# Patient Record
Sex: Female | Born: 1970
Health system: Southern US, Community
[De-identification: ages and names within clinical notes are randomized; demographics above are authoritative.]

## PROBLEM LIST (undated history)

## (undated) DIAGNOSIS — R112 Nausea with vomiting, unspecified: Secondary | ICD-10-CM

## (undated) DIAGNOSIS — T7840XA Allergy, unspecified, initial encounter: Secondary | ICD-10-CM

## (undated) DIAGNOSIS — D519 Vitamin B12 deficiency anemia, unspecified: Secondary | ICD-10-CM

## (undated) DIAGNOSIS — C801 Malignant (primary) neoplasm, unspecified: Secondary | ICD-10-CM

## (undated) DIAGNOSIS — C439 Malignant melanoma of skin, unspecified: Secondary | ICD-10-CM

## (undated) DIAGNOSIS — Z9889 Other specified postprocedural states: Secondary | ICD-10-CM

## (undated) HISTORY — DX: Other specified postprocedural states: Z98.890

## (undated) HISTORY — PX: TUBAL LIGATION: SHX77

## (undated) HISTORY — DX: Allergy, unspecified, initial encounter: T78.40XA

## (undated) HISTORY — DX: Nausea with vomiting, unspecified: R11.2

## (undated) HISTORY — DX: Malignant (primary) neoplasm, unspecified: C80.1

## (undated) HISTORY — PX: WISDOM TOOTH EXTRACTION: SHX21

## (undated) HISTORY — DX: Vitamin B12 deficiency anemia, unspecified: D51.9

## (undated) HISTORY — PX: OTHER SURGICAL HISTORY: SHX169

## (undated) HISTORY — DX: Malignant melanoma of skin, unspecified: C43.9

---

## 1997-11-03 ENCOUNTER — Other Ambulatory Visit: Admission: RE | Admit: 1997-11-03 | Discharge: 1997-11-03 | Payer: Self-pay | Admitting: Obstetrics and Gynecology

## 1997-11-25 ENCOUNTER — Inpatient Hospital Stay (HOSPITAL_COMMUNITY): Admission: AD | Admit: 1997-11-25 | Discharge: 1997-11-28 | Payer: Self-pay | Admitting: Gynecology

## 1998-01-08 ENCOUNTER — Other Ambulatory Visit: Admission: RE | Admit: 1998-01-08 | Discharge: 1998-01-08 | Payer: Self-pay | Admitting: Gynecology

## 1998-12-14 ENCOUNTER — Other Ambulatory Visit: Admission: RE | Admit: 1998-12-14 | Discharge: 1998-12-14 | Payer: Self-pay | Admitting: Gynecology

## 1999-06-28 ENCOUNTER — Other Ambulatory Visit: Admission: RE | Admit: 1999-06-28 | Discharge: 1999-06-28 | Payer: Self-pay | Admitting: Gynecology

## 1999-12-18 ENCOUNTER — Other Ambulatory Visit: Admission: RE | Admit: 1999-12-18 | Discharge: 1999-12-18 | Payer: Self-pay | Admitting: Gynecology

## 2001-04-14 ENCOUNTER — Other Ambulatory Visit: Admission: RE | Admit: 2001-04-14 | Discharge: 2001-04-14 | Payer: Self-pay | Admitting: Gynecology

## 2002-05-10 ENCOUNTER — Other Ambulatory Visit: Admission: RE | Admit: 2002-05-10 | Discharge: 2002-05-10 | Payer: Self-pay | Admitting: Gynecology

## 2003-06-19 ENCOUNTER — Other Ambulatory Visit: Admission: RE | Admit: 2003-06-19 | Discharge: 2003-06-19 | Payer: Self-pay | Admitting: Gynecology

## 2004-01-10 ENCOUNTER — Inpatient Hospital Stay (HOSPITAL_COMMUNITY): Admission: AD | Admit: 2004-01-10 | Discharge: 2004-01-12 | Payer: Self-pay | Admitting: Gynecology

## 2004-02-07 ENCOUNTER — Other Ambulatory Visit: Admission: RE | Admit: 2004-02-07 | Discharge: 2004-02-07 | Payer: Self-pay | Admitting: Gynecology

## 2005-11-18 ENCOUNTER — Other Ambulatory Visit: Admission: RE | Admit: 2005-11-18 | Discharge: 2005-11-18 | Payer: Self-pay | Admitting: Gynecology

## 2006-04-03 ENCOUNTER — Ambulatory Visit (HOSPITAL_COMMUNITY): Admission: RE | Admit: 2006-04-03 | Discharge: 2006-04-03 | Payer: Self-pay | Admitting: Gynecology

## 2006-04-08 ENCOUNTER — Ambulatory Visit: Payer: Self-pay | Admitting: Obstetrics & Gynecology

## 2006-04-10 ENCOUNTER — Ambulatory Visit: Payer: Self-pay | Admitting: Obstetrics & Gynecology

## 2006-04-14 ENCOUNTER — Ambulatory Visit: Payer: Self-pay | Admitting: Obstetrics & Gynecology

## 2006-04-17 ENCOUNTER — Ambulatory Visit: Payer: Self-pay | Admitting: Gynecology

## 2006-04-20 ENCOUNTER — Ambulatory Visit: Payer: Self-pay | Admitting: Obstetrics & Gynecology

## 2006-04-24 ENCOUNTER — Ambulatory Visit: Payer: Self-pay | Admitting: Obstetrics and Gynecology

## 2006-04-28 ENCOUNTER — Ambulatory Visit: Payer: Self-pay | Admitting: Obstetrics and Gynecology

## 2006-05-01 ENCOUNTER — Ambulatory Visit: Payer: Self-pay | Admitting: Family Medicine

## 2006-05-05 ENCOUNTER — Ambulatory Visit: Payer: Self-pay | Admitting: Obstetrics and Gynecology

## 2006-05-06 ENCOUNTER — Inpatient Hospital Stay (HOSPITAL_COMMUNITY): Admission: RE | Admit: 2006-05-06 | Discharge: 2006-05-09 | Payer: Self-pay | Admitting: Gynecology

## 2006-05-06 ENCOUNTER — Encounter (INDEPENDENT_AMBULATORY_CARE_PROVIDER_SITE_OTHER): Payer: Self-pay | Admitting: *Deleted

## 2006-06-09 ENCOUNTER — Other Ambulatory Visit: Admission: RE | Admit: 2006-06-09 | Discharge: 2006-06-09 | Payer: Self-pay | Admitting: Gynecology

## 2007-10-06 ENCOUNTER — Other Ambulatory Visit: Admission: RE | Admit: 2007-10-06 | Discharge: 2007-10-06 | Payer: Self-pay | Admitting: Obstetrics and Gynecology

## 2008-02-10 IMAGING — US US OB DETAIL+14 WK
1 series · 14 of 28 positions shown · non-contrast
Comparison: none

OBSTETRICAL ULTRASOUND:
 This ultrasound was performed in The [HOSPITAL], and the [facility] report will be stored to [REDACTED] PACS.

[Series 1: us ob detail+14 wk · 14 of 56 slices shown]
[im 3/56]
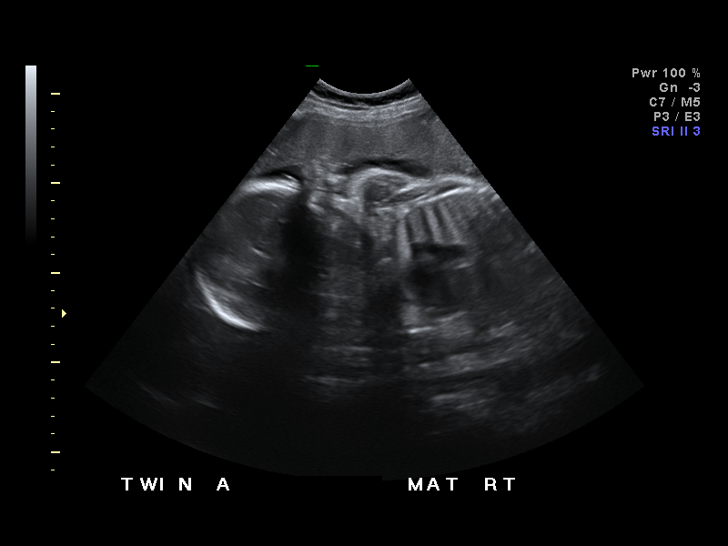
[im 7/56]
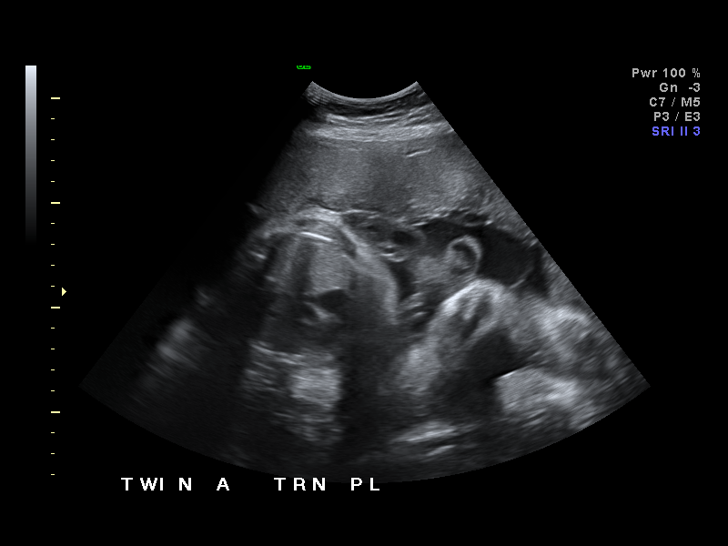
[im 11/56]
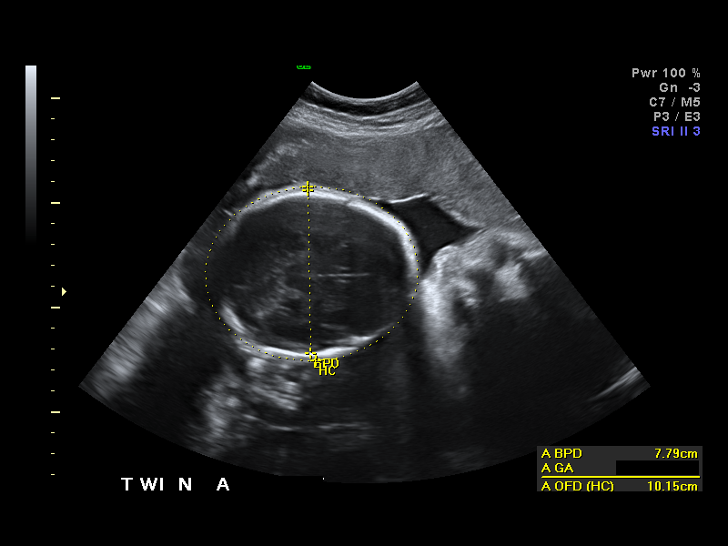
[im 15/56]
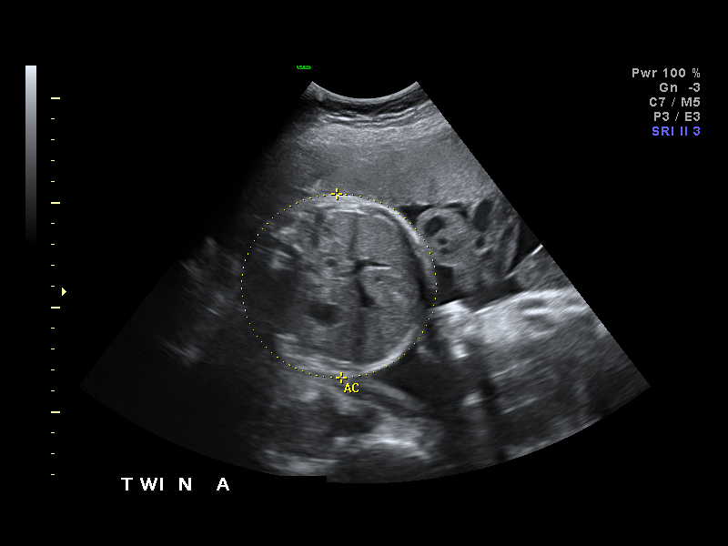
[im 19/56]
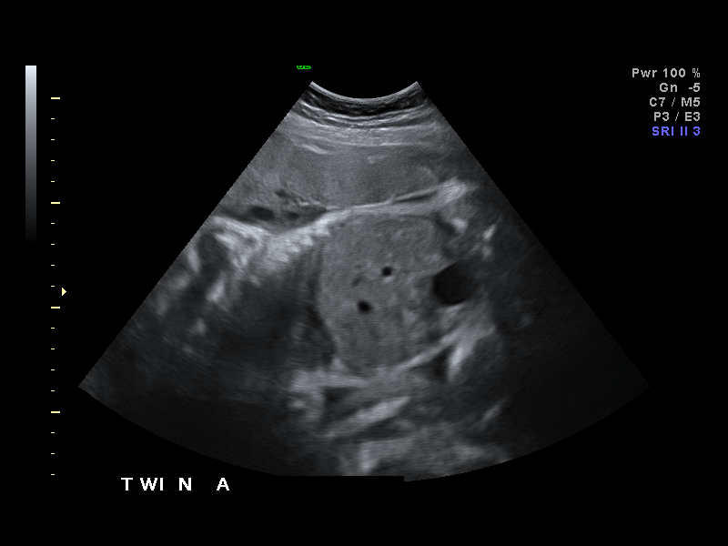
[im 23/56]
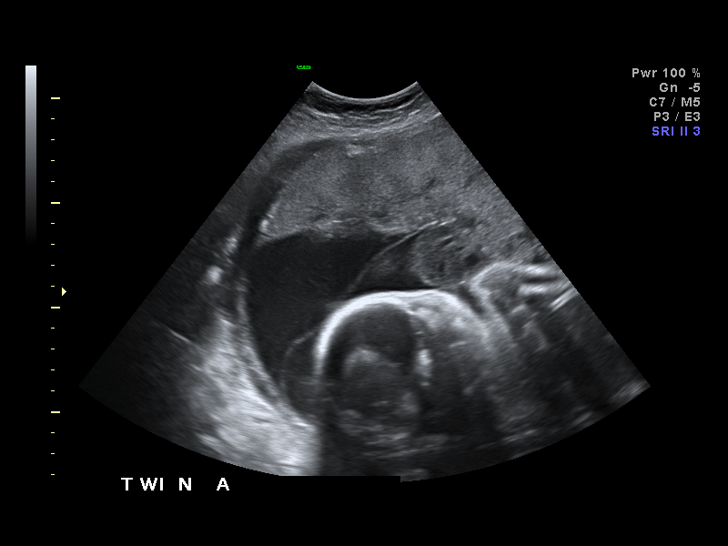
[im 27/56]
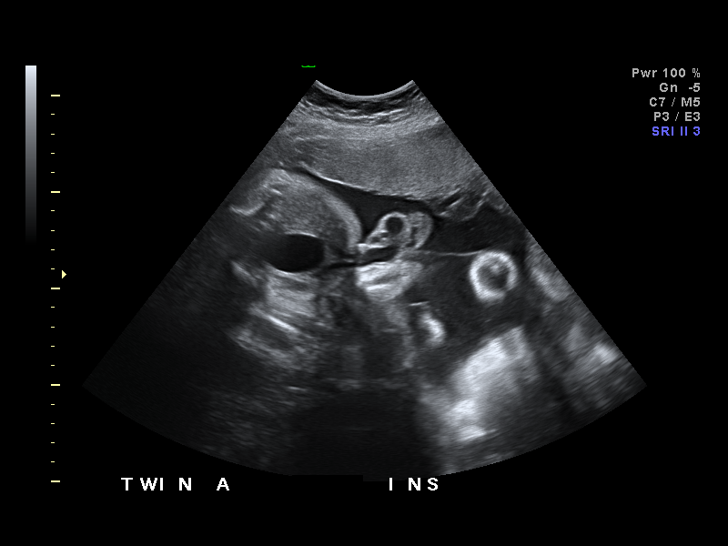
[im 31/56]
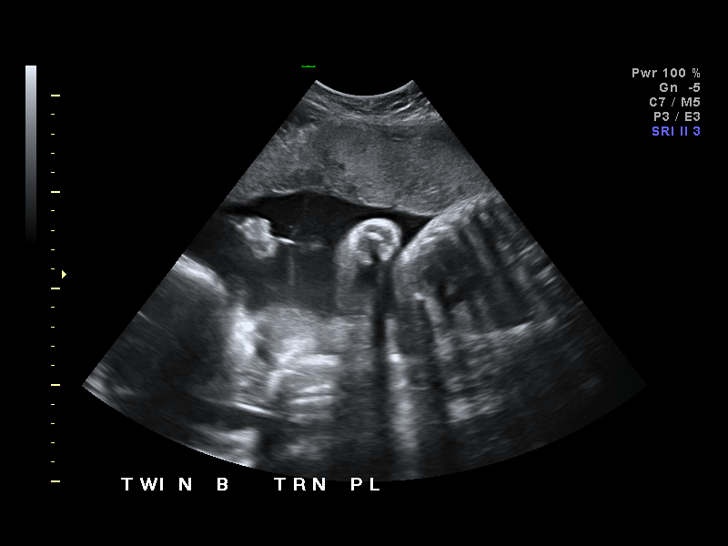
[im 35/56]
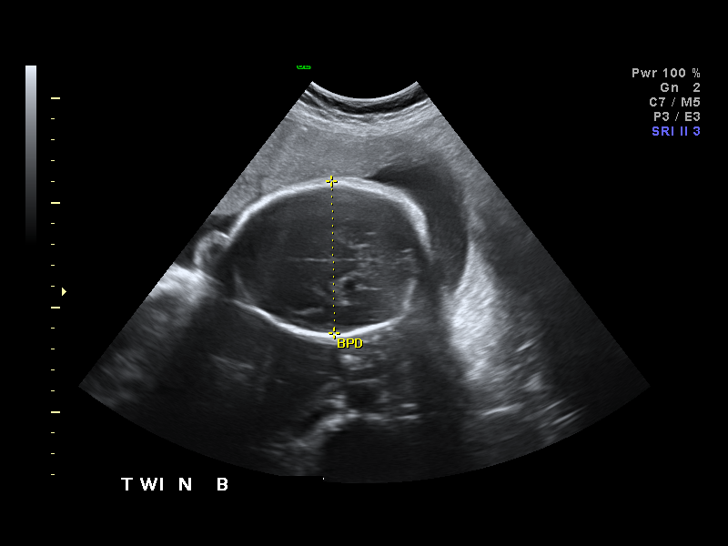
[im 39/56]
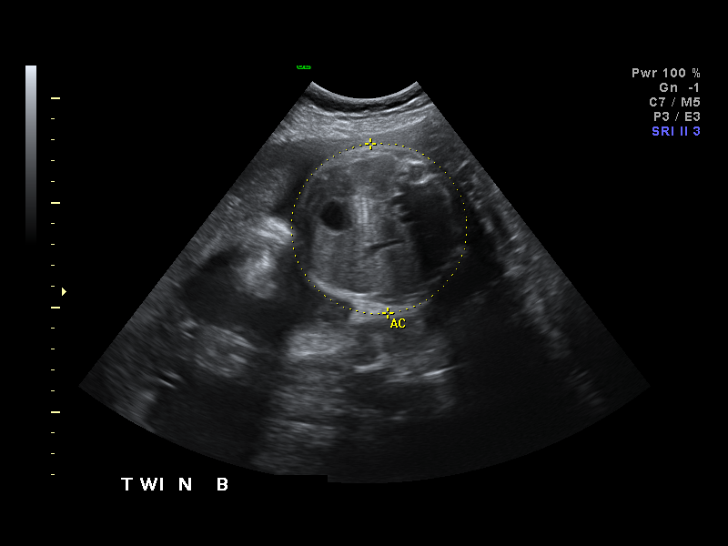
[im 43/56]
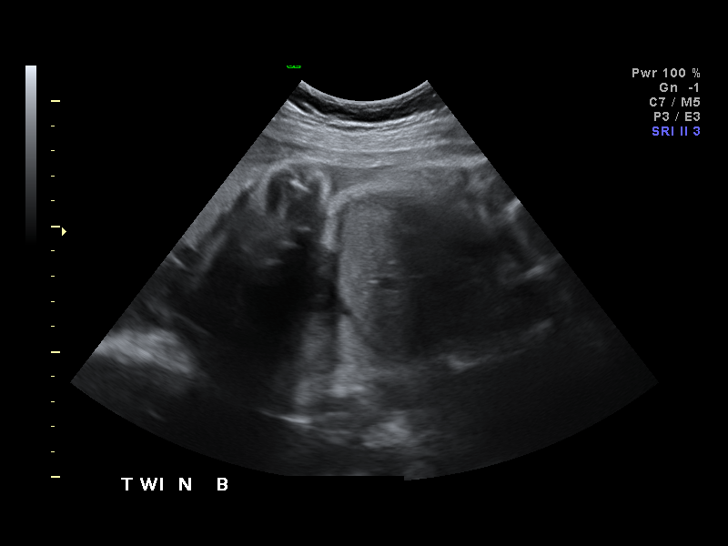
[im 47/56]
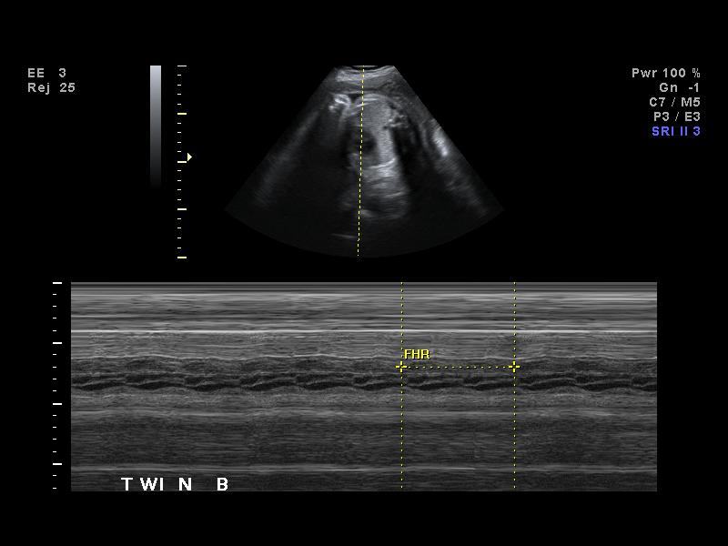
[im 51/56]
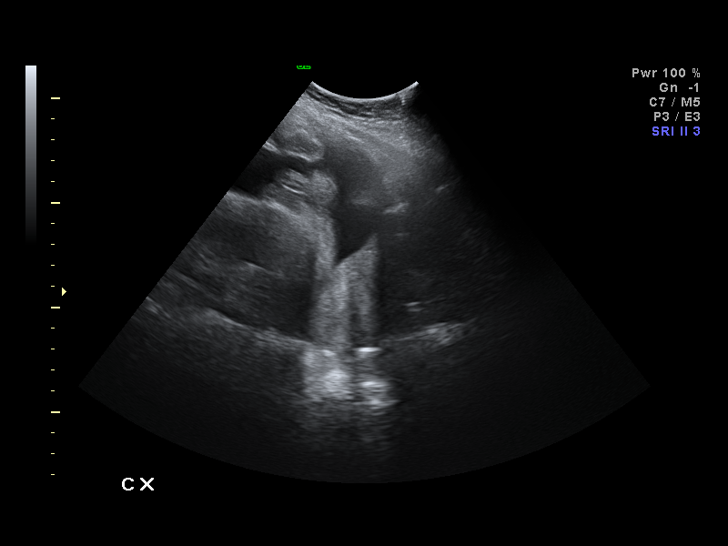
[im 56/56]
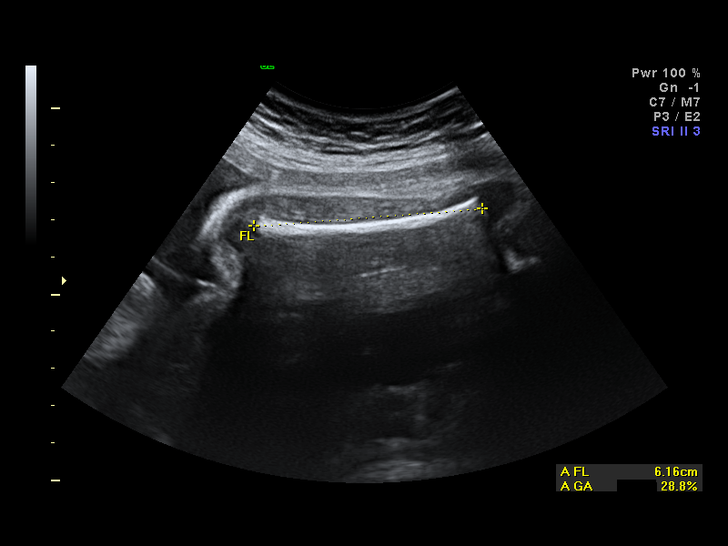

[14 of 28 positions shown; findings below may reference images not displayed]

IMPRESSION: The [facility] report has also been faxed to the ordering physician.

## 2008-04-05 ENCOUNTER — Ambulatory Visit: Payer: Self-pay | Admitting: Gynecology

## 2008-12-14 ENCOUNTER — Ambulatory Visit: Payer: Self-pay | Admitting: Women's Health

## 2008-12-14 ENCOUNTER — Encounter: Payer: Self-pay | Admitting: Women's Health

## 2008-12-14 ENCOUNTER — Other Ambulatory Visit: Admission: RE | Admit: 2008-12-14 | Discharge: 2008-12-14 | Payer: Self-pay | Admitting: Gynecology

## 2010-08-23 NOTE — Discharge Summary (Signed)
NAMEROBY, SPALLA       ACCOUNT NO.:  [id number]   MEDICAL RECORD NO.:  [id number]          PATIENT TYPE:  INP   LOCATION:                                [facility]   PHYSICIAN:  Juan H. Lily Peer, M.D.DATE OF BIRTH:  [birth year]   DATE OF ADMISSION:  05/05/2006  DATE OF DISCHARGE:  05/09/2006                               DISCHARGE SUMMARY   HISTORY:  Patient is a 40 year old gravida 3, para 2, at [redacted] weeks  gestation with twin gestation (monochorionic and dichorionic) in breech  presentation.  Underwent a primary cesarean section along with bilateral  tubal sterilization procedure on May 06, 2006.  The patient at the  time of surgery was found to have a left ovarian mass, which appears to  be a fibroma, which was excised and is pending at the time of this  dictation.  Patient delivered two female infants each with Apgars of 8  and 9.  The first twin weighed 6 pounds, 4 ounces.  The second twin  weighed 4 pounds, 12 ounces.  The patient did well postoperatively.  Her  hemoglobin and hematocrit were 10.1 and 28.5, respectively with a  platelet count of 183,000.  She was O+.  Rubella was negative.  She was  starting to ambulate.  Her Foley catheter was discontinued after 24  hours.  Her diet was gradually increased from a clear liquid to a  regular diet.  She was passing flatus, and she was ready to be  discharged home on her third postoperative day.  Her incision was  intact.  The incisional staples were to be removed and Steri-stripped  before discharged.   FINAL DIAGNOSES:  1. Term intrauterine pregnancy (twins, term, 58 weeks/delivered).  2. Homero Fellers breech presentation x2.  3. Request for elective permanent sterilization.  4. Left ovarian mass.   PROCEDURE PERFORMED:  1. Primary low uterine transverse cesarean section.  2. Excision of left ovarian mass, pathology report pending.  3. Bilateral tubal sterilization, procedure Pomeroy technique.   FINAL  DISPOSITION:  Followed.  Patient was discharged home on the third  postoperative day.  She was passing flatus, tolerating a regular diet  well.  Her staples were removed.  Her incision was Steri-stripped.  She  was to receive the Rubella vaccine before discharge.  She was to  continue her prenatal vitamins and iron at home, and a prescription was  given for Tylox to take 1 p.o. q.4-6h. p.r.n. pain and for Motrin 800 mg  1 p.o. t.i.d. p.r.n.  She was instructed to follow up in the office in  four weeks for her postpartum visit.      Juan H. Lily Peer, M.D.  Electronically Signed     JHF/MEDQ  D:  05/09/2006  T:  05/09/2006  Job:  [id number]

## 2010-08-23 NOTE — H&P (Signed)
NAMETEILA, SKALSKY       ACCOUNT NO.:  [id number]   MEDICAL RECORD NO.:  [id number]          PATIENT TYPE:  INP   LOCATION:                                [facility]   PHYSICIAN:  Juan H. Lily Peer, M.D.DATE OF BIRTH:  08-Mar-1971   DATE OF ADMISSION:  DATE OF DISCHARGE:                              HISTORY & PHYSICAL   CHIEF COMPLAINT:  1. Twin gestation (monochorionic diamniotic).  2. Breech presentation.  3. Term pregnancy, 37 weeks' estimated gestational age.   HISTORY:  The patient is a 40 year old, gravida 3, para 2, with a  corrected estimated date of confinement on May 27, 2005.  The  patient is with monochorionic diamniotic twins in breech presentation.  The patient had been offered genetic amniocentesis and testing, and she  had declined.  She said that she would not terminate under any  circumstances.  The patient had been followed closely with serial  ultrasounds for monitoring the growth of the twins.  At one point, there  was a question if there was discordancy at 32 weeks where twin A was in  the 76th percentile and twin B in the 7th percentile.  She will be  referred to the [facility].  The estimated fetal weight  on twin A was in the 49th percentile and in twin B in the 25th  percentile, a discordancy of 13%.  It was recommended to progress with  the pregnancy because all other parameters looked good and to followup  with an ultrasound in two to three weeks for intimal growth and twice  weekly non-stress testing.   The patient was followed closely.  Her last ultrasound and followup exam  were on January 18th.  Twin A was now in the 66th percentile in growth  and twin B in the 35th percentile in growth curve.  The amniotic fluid  was normal for both, but they were both in the breech presentation.  The  patient is scheduled now for a primary cesarean section along with an  elective bilateral tubal sterilization procedure.   PAST  MEDICAL HISTORY:  The patient has had two prior normal spontaneous  vaginal deliveries.  No other medical problems reported.   ALLERGIES:  She denies any allergies.   REVIEW OF SYSTEMS:  See hospital form.   PHYSICAL EXAMINATION:  VITAL SIGNS:  The patient's blood pressure is  120/78.  Urine was negative for protein or glucose.  Her weight was 193  pounds.  HEENT:  Unremarkable.  NECK:  The neck is supple.  The trachea is midline.  No carotid bruits.  No thyromegaly.  LUNGS:  Clear to auscultation without any rhonchi or wheezes.  HEART:  Regular rate and rhythm.  No murmurs or gallops.  BREASTS:  Not done.  ABDOMEN:  Gravid uterus.  Fundal height 41 cm.  Cervix long, closed, and  posterior.  EXTREMITIES:  DTRs 1+.  Negative for clonus.   LABORATORY DATA:  The prenatal labs revealed 0 positive blood type.  Negative antibody screen.  VDRL was nonreactive.  Rubella immune.  Hepatitis B surface antigen and HIV were negative.  The alpha  fetoprotein was declined.  GBS culture is pending at the time of this  dictation.   ASSESSMENT:  This is a 40 year old, gravida 3, para 2, at 42 weeks'  gestation with twin gestation (monochorionic diamniotic) both in breech  presentation.  The patient is scheduled for a primary cesarean section  with elective bilateral tubal sterilization procedure.  The risks,  benefits, pros, cons, and failure rate of tubal sterilization procedure  were discussed as well.  All of her questions were answered and will  follow accordingly.   PLAN:  The patient is scheduled for a primary cesarean section along  with bilateral tubal sterilization procedure on Wednesday, May 06, 2006 at [facility].      [place] H. Lily Peer, M.D.  Electronically Signed     JHF/MEDQ  D:  05/04/2006  T:  05/04/2006  Job:  [id number]

## 2010-08-23 NOTE — Op Note (Signed)
NAMEWEDA, BAUMGARNER       ACCOUNT NO.:  [id number]   MEDICAL RECORD NO.:  [id number]          PATIENT TYPE:  INP   LOCATION:  9105                          [facility]ACILITY:  [facility]   PHYSICIAN:  Juan H. Lily Peer, M.D.DATE OF BIRTH:  October 19, 1970   DATE OF PROCEDURE:  05/06/2006  DATE OF DISCHARGE:                               OPERATIVE REPORT   SURGEON:  Juan H. Lily Peer, M.D.   FIRST ASSISTANT:  Timothy P. Fontaine, M.D.   INDICATIONS FOR OPERATION:  A 40 year old gravida 3, para 2 with twin  gestation, breech presentation.  The patient also requesting elective  permanent sterilization; and also with evidence of slight discordant  growth in the twins.   PREOPERATIVE DIAGNOSIS:  1. Term intrauterine pregnancy.  2. Twin gestation.  3. Breech presentation x2.  4. Discordant growth.  5. Requests elective permanent sterilization.   POSTOPERATIVE DIAGNOSIS:  1. Term intrauterine pregnancy.  2. Twin gestation.  3. Breech presentation x2.  4. Discordant growth.  5. Requests elective permanent sterilization.   ANESTHESIA:  Spinal.   PROCEDURE PERFORMED:  1. Primary lower uterine segment transverse cesarean section.  2. Bilateral tubal sterilization procedure, Pomeroy technique.   FINDINGS:  Normal pelvic anatomy.  Twin gestation both in the breech  presentation, separate sacs clear amniotic fluid.  Twin A female, Apgars  of 8 and 9 and 6 pounds 4 ounces; twin B Apgars of 8 and 09 and weight 4  pounds 12 ounces.   DESCRIPTION OF OPERATION:  After the patient was adequately counseled,  she was taken to the operating room where she underwent successful  spinal anesthesia.  The abdomen was prepped and draped in the usual  sterile fashion.  A Pfannenstiel skin incision was made 2 cm above the  symphysis pubis.  Of note, a Foley catheter had previously been inserted  prior to commencement of the operation in an effort to monitor urinary  output.  After the skin incision was made,  the incision was carried down  to the rectus fascia where a midline nick was made.  The fascia was  incised in a transverse fashion.  The midline raphe was entered  cautiously.  The bladder flap was established; and the lower uterine  segment was incised in a transverse fashion.  Clear amniotic fluid was  present.   The sac of twin A was ruptured and was delivered in the breech  presentation.  The nasopharyngeal area was bulb suctioned.  The cord was  doubly clamped and excised.  The newborn gave an immediate cry; and was  passed off to the pediatrician who gave the above-mentioned parameters.  The sac of twin B was ruptured; and twin B was delivered also in the  breech presentation as well.  The cord was doubly clamped and excised;  and the nasopharyngeal area was bulb suctioned; and twin B was  appropriately identified, and passed off to the neonatologist who was in  attendance who gave the above-mentioned parameters.  After the placenta  and cords were delivered from the intrauterine cavity; they were  submitted for histological evaluation with appropriate identification.  Pitocin drip was started.  The patient  received a gram of Ancef.  The  uterus was exteriorized.  The intrauterine cavity was swept clear of the  remaining products of conception; and the lower uterine segment  transverse incision was closed in a double-layered fashion.  The first  layer in a interlocking stitch; the second layer in an imbricating  manner.   Attention was then placed to the proximal portion of fallopian tube.  A  2-cm segment was tied with 3-0 Vicryl suture x2; and a 2 cm segment was  excised, passed off the operative field, and the remaining stumps were  Bovie cauterized.  It was at this time that a growth extending from over  the left ovary was evident with a gelatinous material extruding from the  capsule area.  This area was excised with the Bovie; and passed off  histological evaluation.    The right ovary appeared to be normal, and the proximal one third  portion of the right fallopian tube was grasped with Babcock clamp and  then a 2-cm segment was also tied with 3-0 Vicryl sutures x2; and  excised; and passed off the operative field for histological evaluation.  The remaining stump was also Bovie cauterized.  The uterus was then  placed back into the abdominal cavity.  The pelvic cavity was irrigated  with normal saline solution.  After ascertaining adequate hemostasis,  closure was started.  The visceral peritoneum was not closed.  The  rectus fascia was closed with a running stitch of #0 Vicryl suture; and  the subcutaneous bleeders were Bovie cauterized.  The skin was  reapproximated with skin clips; followed by placement of Xeroform gauze  and 4 x 4 and dressing.  The patient tolerated the procedure well; was  transferred to recovery room with stable vital signs.  Blood loss was 1  liter.  IV fluids consisted of 4100 mL of lactated Ringer's; and urine  output was 100 mL.      Juan H. Lily Peer, M.D.  Electronically Signed     JHF/MEDQ  D:  05/06/2006  T:  05/06/2006  Job:  [id number]

## 2010-08-23 NOTE — H&P (Signed)
NAMESHARONDA, Anna Hernandez       ACCOUNT NO.:  [id number]   MEDICAL RECORD NO.:  [id number]          PATIENT TYPE:  MAT   LOCATION:  [facility]C                          [facility]ILITY:  [facility]   PHYSICIAN:  Ivor Costa. Farrel Gobble, M.D. DATE OF BIRTH:  30-Dec-1970   DATE OF ADMISSION:  DATE OF DISCHARGE:                                HISTORY & PHYSICAL   CHIEF COMPLAINT:  Post-dates pregnancy.   HISTORY OF PRESENT ILLNESS:  The patient is a 40 year old, G2, P5, with an  estimated date of confinement of January 01, 2004, based on a first  trimester ultrasound, who is currently 11 and 3/7ths weeks, who presents now  for an elective induction of labor secondary to post-date status.  Her  pregnancy has been complicated by advanced maternal age, for which an  amniocentesis was performed, and normal chromosome analysis.  The patient is  without any complaints.  She reports good fetal movement.  No vaginal  bleeding, and no contractions.  She is O positive, antibody negative, RPR  nonreactive, rubella immune, but equivocal titer, hepatitis B surface  antigen non-reactive, HIV non-reactive, GBS negative.  Refer to the  hollisters.   PHYSICAL EXAMINATION:  GENERAL:  She is a well-appearing gravida in no acute  distress.  A 41 pound weight gain.  VITAL SIGNS:  She is afebrile.  Her vital signs are stable.  HEART:  Regular rate.  LUNGS:  Clear to auscultation.  ABDOMEN:  Gravid, soft, nontender, with a fundal height of 39.  Heart tones  were auscultated.  PELVIC:  On vaginal exam, she was 1, long, -3, and posterior.  EXTREMITIES:  A trace edema.   ASSESSMENT:  Post-dates pregnancy, for induction.   The patient will present in the evening for Cervidil and begin high-dose  Pitocin at 6 a.m.      THL/MEDQ  D:  01/04/2004  T:  01/04/2004  Job:  [id number]

## 2010-09-30 ENCOUNTER — Other Ambulatory Visit (HOSPITAL_COMMUNITY)
Admission: RE | Admit: 2010-09-30 | Discharge: 2010-09-30 | Disposition: A | Discharge status: Home / Self Care | Payer: 59 | Source: Ambulatory Visit | Attending: Obstetrics and Gynecology | Admitting: Obstetrics and Gynecology

## 2010-09-30 ENCOUNTER — Other Ambulatory Visit: Payer: Self-pay | Admitting: Women's Health

## 2010-09-30 ENCOUNTER — Encounter (INDEPENDENT_AMBULATORY_CARE_PROVIDER_SITE_OTHER): Payer: 59 | Admitting: Women's Health

## 2010-09-30 DIAGNOSIS — R82998 Other abnormal findings in urine: Secondary | ICD-10-CM

## 2010-09-30 DIAGNOSIS — Z124 Encounter for screening for malignant neoplasm of cervix: Secondary | ICD-10-CM | POA: Insufficient documentation

## 2010-09-30 DIAGNOSIS — Z01419 Encounter for gynecological examination (general) (routine) without abnormal findings: Secondary | ICD-10-CM

## 2011-06-09 ENCOUNTER — Other Ambulatory Visit: Payer: Self-pay | Admitting: Dermatology

## 2011-07-03 ENCOUNTER — Other Ambulatory Visit: Payer: Self-pay | Admitting: Dermatology

## 2012-01-29 ENCOUNTER — Encounter: Payer: Self-pay | Admitting: Women's Health

## 2012-05-14 ENCOUNTER — Encounter: Payer: Self-pay | Admitting: Women's Health

## 2012-05-14 ENCOUNTER — Ambulatory Visit (INDEPENDENT_AMBULATORY_CARE_PROVIDER_SITE_OTHER): Payer: 59 | Admitting: Women's Health

## 2012-05-14 VITALS — BP 106/60 | Ht 63.5 in | Wt 143.0 lb

## 2012-05-14 DIAGNOSIS — Z13220 Encounter for screening for lipoid disorders: Secondary | ICD-10-CM

## 2012-05-14 DIAGNOSIS — Z01419 Encounter for gynecological examination (general) (routine) without abnormal findings: Secondary | ICD-10-CM

## 2012-05-14 DIAGNOSIS — E079 Disorder of thyroid, unspecified: Secondary | ICD-10-CM

## 2012-05-14 DIAGNOSIS — Z833 Family history of diabetes mellitus: Secondary | ICD-10-CM

## 2012-05-14 DIAGNOSIS — C439 Malignant melanoma of skin, unspecified: Secondary | ICD-10-CM

## 2012-05-14 LAB — CBC WITH DIFFERENTIAL/PLATELET
Basophils Relative: 0 % (ref 0–1)
Eosinophils Absolute: 0.1 10*3/uL (ref 0.0–0.7)
HCT: 37.1 % (ref 36.0–46.0)
Hemoglobin: 12.4 g/dL (ref 12.0–15.0)
Lymphs Abs: 2 10*3/uL (ref 0.7–4.0)
MCHC: 33.4 g/dL (ref 30.0–36.0)
Monocytes Relative: 7 % (ref 3–12)
Neutrophils Relative %: 61 % (ref 43–77)
Platelets: 317 10*3/uL (ref 150–400)
RBC: 4.15 MIL/uL (ref 3.87–5.11)
RDW: 13.6 % (ref 11.5–15.5)

## 2012-05-14 NOTE — Progress Notes (Signed)
Anna Hernandez [birth year] [id number]    History:    The patient presents for annual exam.  Regular monthly cycle/BTL. History of normal mammograms. Pap ascus with negative C&B 2001, normal Paps following. Melanoma on left shoulder Dr. Jordan/2013.   Past medical history, past surgical history, family history and social history were all reviewed and documented in the EPIC chart. Jonah 14, Luke 8, Rosemary and Lili/ twins 6, all doing well. Works for the city [employer] [place].   ROS:  A  ROS was performed and pertinent positives and negatives are included in the history.  Exam:  Filed Vitals:   05/14/12 1008  BP: 106/60    General appearance:  Normal Head/Neck:  Normal, without cervical or supraclavicular adenopathy. Thyroid:  Symmetrical, normal in size, without palpable masses or nodularity. Respiratory  Effort:  Normal  Auscultation:  Clear without wheezing or rhonchi Cardiovascular  Auscultation:  Regular rate, without rubs, murmurs or gallops  Edema/varicosities:  Not grossly evident Abdominal  Soft,nontender, without masses, guarding or rebound.  Liver/spleen:  No organomegaly noted  Hernia:  None appreciated  Skin  Inspection:  Grossly normal  Palpation:  Grossly normal Neurologic/psychiatric  Orientation:  Normal with appropriate conversation.  Mood/affect:  Normal  Genitourinary    Breasts: Examined lying and sitting.     Right: Without masses, retractions, discharge or axillary adenopathy.     Left: Without masses, retractions, discharge or axillary adenopathy.   Inguinal/mons:  Normal without inguinal adenopathy  External genitalia:  Normal  BUS/Urethra/Skene's glands:  Normal  Bladder:  Normal  Vagina:  Normal  Cervix:  Normal  Uterus:   normal in size, shape and contour.  Midline and mobile  Adnexa/parametria:     Rt: Without masses or tenderness.   Lt: Without masses or tenderness.  Anus and perineum: Normal  Digital rectal exam: Normal sphincter  tone without palpated masses or tenderness  Assessment/Plan:  42 y.o. M. WF G3 P4 for annual exam with complaint of decreased libido.  Normal GYN exam/BTL Decreased libido Melanoma-left shoulder 2013/ Dr. Swazil[place]  Plan: CBC, glucose, lipid panel, TSH, UA. Pap normal 2012, new screening guidelines reviewed. Reviewed importance of taking time for relationship. Extremely busy with 4 children ages 44 through 1. SBE's, continue annual mammogram, calcium rich diet, exercise, vitamin D 1000 daily encouraged. Continue every six-month skin checks as recommended.    Harrington Challenger WHNP, 11:00 AM 05/14/2012

## 2012-05-14 NOTE — Patient Instructions (Addendum)

## 2012-05-15 LAB — LIPID PANEL
Cholesterol: 165 mg/dL (ref 0–200)
HDL: 64 mg/dL (ref >39)
LDL Cholesterol: 89 mg/dL (ref 0–99)
Total CHOL/HDL Ratio: 2.6 Ratio
Triglycerides: 60 mg/dL (ref <150)

## 2012-05-15 LAB — URINALYSIS W MICROSCOPIC + REFLEX CULTURE
Bilirubin Urine: NEGATIVE
Casts: NONE SEEN
Crystals: NONE SEEN
Hgb urine dipstick: NEGATIVE
Nitrite: NEGATIVE
Protein, ur: NEGATIVE mg/dL
Specific Gravity, Urine: 1.02 (ref 1.005–1.030)

## 2012-05-15 LAB — TSH: TSH: 1.536 u[IU]/mL (ref 0.350–4.500)

## 2012-05-22 ENCOUNTER — Other Ambulatory Visit: Payer: Self-pay

## 2013-02-10 ENCOUNTER — Other Ambulatory Visit: Payer: Self-pay

## 2013-03-15 ENCOUNTER — Encounter: Payer: Self-pay | Admitting: General Practice

## 2013-03-21 ENCOUNTER — Encounter: Payer: Self-pay | Admitting: Emergency Medicine

## 2013-03-21 ENCOUNTER — Ambulatory Visit (INDEPENDENT_AMBULATORY_CARE_PROVIDER_SITE_OTHER): Payer: 59 | Admitting: Emergency Medicine

## 2013-03-21 VITALS — BP 106/72 | HR 74 | Temp 98.2°F | Resp 18 | Ht 64.0 in | Wt 144.0 lb

## 2013-03-21 DIAGNOSIS — Z0000 Encounter for general adult medical examination without abnormal findings: Secondary | ICD-10-CM

## 2013-03-21 DIAGNOSIS — Z111 Encounter for screening for respiratory tuberculosis: Secondary | ICD-10-CM

## 2013-03-21 DIAGNOSIS — J309 Allergic rhinitis, unspecified: Secondary | ICD-10-CM | POA: Insufficient documentation

## 2013-03-21 DIAGNOSIS — Z1212 Encounter for screening for malignant neoplasm of rectum: Secondary | ICD-10-CM

## 2013-03-21 LAB — LIPID PANEL
HDL: 54 mg/dL (ref >39)
Total CHOL/HDL Ratio: 2.9 Ratio
VLDL: 20 mg/dL (ref 0–40)

## 2013-03-21 LAB — BASIC METABOLIC PANEL WITH GFR
BUN: 12 mg/dL (ref 6–23)
CO2: 23 mEq/L (ref 19–32)
Calcium: 9.1 mg/dL (ref 8.4–10.5)
Chloride: 105 mEq/L (ref 96–112)
Creat: 0.65 mg/dL (ref 0.50–1.10)
Potassium: 4 mEq/L (ref 3.5–5.3)
Sodium: 137 mEq/L (ref 135–145)

## 2013-03-21 LAB — CBC WITH DIFFERENTIAL/PLATELET
Basophils Absolute: 0 10*3/uL (ref 0.0–0.1)
Hemoglobin: 12.7 g/dL (ref 12.0–15.0)
Lymphs Abs: 2.1 10*3/uL (ref 0.7–4.0)
Monocytes Absolute: 0.4 10*3/uL (ref 0.1–1.0)
Monocytes Relative: 5 % (ref 3–12)
Neutro Abs: 5 10*3/uL (ref 1.7–7.7)
Platelets: 271 10*3/uL (ref 150–400)
WBC: 7.6 10*3/uL (ref 4.0–10.5)

## 2013-03-21 LAB — HEPATIC FUNCTION PANEL
Alkaline Phosphatase: 48 U/L (ref 39–117)
Indirect Bilirubin: 0.3 mg/dL (ref 0.0–0.9)

## 2013-03-21 LAB — TSH: TSH: 0.761 u[IU]/mL (ref 0.350–4.500)

## 2013-03-21 LAB — MAGNESIUM: Magnesium: 1.9 mg/dL (ref 1.5–2.5)

## 2013-03-21 LAB — HEMOGLOBIN A1C: Hgb A1c MFr Bld: 5.3 % (ref <5.7)

## 2013-03-22 LAB — INSULIN, FASTING: Insulin fasting, serum: 13 u[IU]/mL (ref 3–28)

## 2013-03-22 LAB — URINALYSIS, ROUTINE W REFLEX MICROSCOPIC
Bilirubin Urine: NEGATIVE
Glucose, UA: NEGATIVE mg/dL
Nitrite: NEGATIVE

## 2013-03-22 NOTE — Progress Notes (Signed)
Subjective:    Patient ID: Anna Hernandez, female    DOB: 08/24/1970, 42 y.o.   MRN: [id number]  HPI Comments: 42 YO FEMALE for CPE. She notes she is doing well overall. She is eating healthy and exercising regularly. She has no concerns or complaints today.   She notes her allergies are controlled with Ns and Allegra and has not had any recent infections.  She has yearly evaluation for skin with Melanoma hx at Dr. Illa Level. She denies any skin changes.   Current Outpatient Prescriptions on File Prior to Visit  Medication Sig Dispense Refill  . Fexofenadine HCl (ALLEGRA PO) Take by mouth.        . fluticasone (FLONASE) 50 MCG/ACT nasal spray Place 2 sprays into the nose daily.       No current facility-administered medications on file prior to visit.    Review of patient's allergies indicates no known allergies.  Past Medical History  Diagnosis Date  . Allergy   . Melanoma 2013    L shoulder    Past Surgical History  Procedure Laterality Date  . Cesarean section      LEFT OVARIAN ENDOMETRIOMA AND BTSP  . Tubal ligation      AT TIME OF C/S   History  Substance Use Topics  . Smoking status: Former Smoker    Quit date: 03/21/1998  . Smokeless tobacco: Never Used  . Alcohol Use: Yes     Comment: sometimes    Family History  Problem Relation Age of Onset  . Hypertension Mother   . Stroke Mother     TIA  . Hypertension Father   . Diabetes Father   . Cancer Maternal Grandmother     OVARIAN  . Heart disease Maternal Grandfather     Review of Systems  Genitourinary:       GYN- Dr. Maple Hudson Pap 06/2011 WNL due 2015  Skin:       Dr. Sw[place] yearly, 2013 WNL  All other systems reviewed and are negative.    BP 106/72  Pulse 74  Temp(Src) 98.2 F (36.8 C) (Temporal)  Resp 18  Ht 5\' 4"  (1.626 m)  Wt 144 lb (65.318 kg)  BMI 24.71 kg/m2  LMP 03/07/2013     Objective:   Physical Exam  Nursing note and vitals reviewed. Constitutional: She is oriented to  person, place, and time. She appears well-developed and well-nourished. No distress.  HENT:  Head: Normocephalic and atraumatic.  Right Ear: External ear normal.  Left Ear: External ear normal.  Nose: Nose normal.  Mouth/Throat: Oropharynx is clear and moist. No oropharyngeal exudate.  Eyes: Conjunctivae and EOM are normal. Pupils are equal, round, and reactive to light. Right eye exhibits no discharge. Left eye exhibits no discharge. No scleral icterus.  Neck: Normal range of motion. Neck supple. No JVD present. No tracheal deviation present. No thyromegaly present.  Cardiovascular: Normal rate, regular rhythm, normal heart sounds and intact distal pulses.   Pulmonary/Chest: Effort normal and breath sounds normal.  Abdominal: Soft. Bowel sounds are normal. She exhibits no distension and no mass. There is no tenderness. There is no rebound and no guarding.  Genitourinary:  Def to Gyn  Musculoskeletal: Normal range of motion. She exhibits no edema and no tenderness.  Lymphadenopathy:    She has no cervical adenopathy.  Neurological: She is alert and oriented to person, place, and time. She has normal reflexes. No cranial nerve deficit. She exhibits normal muscle tone. Coordination normal.  Skin: Skin  is warm and dry. No rash noted. No erythema. No pallor.  Full at [facility]  Psychiatric: She has a normal mood and affect. Her behavior is normal. Judgment and thought content normal.      EKG NSCSPT WNL    Assessment & Plan:  1. CPE- healthy female, check labs 2. Allergic rhinitis controlled. 3. Past melanoma HX keep yearly full checks at Dr. Illa Level

## 2013-03-23 ENCOUNTER — Encounter: Payer: Self-pay | Admitting: Emergency Medicine

## 2013-04-20 ENCOUNTER — Other Ambulatory Visit: Payer: Self-pay | Admitting: Emergency Medicine

## 2013-04-25 ENCOUNTER — Other Ambulatory Visit: Payer: Self-pay | Admitting: Physician Assistant

## 2013-04-25 MED ORDER — FLUTICASONE PROPIONATE 50 MCG/ACT NA SUSP: 2.0000 | Freq: Every day | NASAL | Status: AC

## 2013-05-13 ENCOUNTER — Ambulatory Visit (INDEPENDENT_AMBULATORY_CARE_PROVIDER_SITE_OTHER): Payer: 59 | Admitting: Physician Assistant

## 2013-05-13 VITALS — BP 100/60 | HR 60 | Temp 98.1°F | Resp 16 | Ht 64.0 in | Wt 148.0 lb

## 2013-05-13 DIAGNOSIS — J0100 Acute maxillary sinusitis, unspecified: Secondary | ICD-10-CM

## 2013-05-13 DIAGNOSIS — M26609 Unspecified temporomandibular joint disorder, unspecified side: Secondary | ICD-10-CM

## 2013-05-13 MED ORDER — AZITHROMYCIN 250 MG PO TABS: ORAL_TABLET | ORAL | Status: DC

## 2013-05-13 MED ORDER — PREDNISONE 20 MG PO TABS: ORAL_TABLET | ORAL | Status: DC

## 2013-05-13 NOTE — Progress Notes (Signed)
   Subjective:    Patient ID: Anna Hernandez, female    DOB: 1971-01-13, 43 y.o.   MRN: [id number]  Sinus Problem This is a new problem. Episode onset: 2 weeks. The problem has been gradually worsening since onset. There has been no fever. The pain is moderate. Associated symptoms include congestion, ear pain, a hoarse voice, sinus pressure and sneezing. Pertinent negatives include no chills, coughing, diaphoresis, headaches, neck pain, shortness of breath, sore throat or swollen glands. Past treatments include oral decongestants and acetaminophen (flonase). The treatment provided mild relief.    Review of Systems  Constitutional: Negative for chills and diaphoresis.  HENT: Positive for congestion, ear pain, hoarse voice, postnasal drip, sinus pressure and sneezing. Negative for facial swelling, nosebleeds, rhinorrhea and sore throat.   Respiratory: Negative.  Negative for cough, chest tightness, shortness of breath and wheezing.   Cardiovascular: Negative.   Gastrointestinal: Negative.   Genitourinary: Negative.   Musculoskeletal: Negative for neck pain.  Neurological: Negative.  Negative for headaches.       Objective:   Physical Exam  Constitutional: She appears well-developed and well-nourished.  HENT:  Head: Normocephalic and atraumatic.  Right Ear: External ear normal.  Nose: Right sinus exhibits maxillary sinus tenderness. Right sinus exhibits no frontal sinus tenderness. Left sinus exhibits maxillary sinus tenderness. Left sinus exhibits no frontal sinus tenderness.  +TMJ left side  Eyes: Conjunctivae and EOM are normal.  Neck: Normal range of motion. Neck supple.  Cardiovascular: Normal rate, regular rhythm, normal heart sounds and intact distal pulses.   Pulmonary/Chest: Effort normal and breath sounds normal. No respiratory distress. She has no wheezes.  Abdominal: Soft. Bowel sounds are normal.  Lymphadenopathy:    She has no cervical adenopathy.  Skin: Skin is  warm and dry.      Assessment & Plan:  TMJ (temporomandibular joint syndrome)- information given to the patient, no gum/decrease hard foods, warm wet wash clothes, decrease stress, talk with dentist about possible night guard,  can do massage, and exercise.   Acute maxillary sinusitis - Plan: azithromycin (ZITHROMAX) 250 MG tablet, predniSONE (DELTASONE) 20 MG tablet

## 2013-05-13 NOTE — Patient Instructions (Signed)
Please take the prednisone to help decrease inflammation and therefore decrease symptoms. Take it it with food to avoid GI upset. It can cause increased energy but on the other hand it can make it hard to sleep at night so please take it in the morning.  It is not an antibiotic so you can stop it early if you are feeling better.  If you are diabetic it will increase your sugars.   The majority of colds are caused by viruses and do not require antibiotics. Please read the rest of this hand out to learn more about the common cold and what you can do to help yourself as well as help prevent the over use of antibiotics.   COMMON COLD SIGNS AND SYMPTOMS - The common cold usually causes nasal congestion, runny nose, and sneezing. A sore throat may be present on the first day but usually resolves quickly. If a cough occurs, it generally develops on about the fourth or fifth day of symptoms, typically when congestion and runny nose are resolving  COMMON COLD COMPLICATIONS - In most cases, colds do not cause serious illness or complications. Most colds last for three to seven days, although many people continue to have symptoms (coughing, sneezing, congestion) for up to two weeks.  One of the more common complications is sinusitis, which is usually caused by viruses and rarely (about 2 percent of the time) by bacteria. Having thick or yellow to green-colored nasal discharge does not mean that bacterial sinusitis has developed; discolored nasal discharge is a normal phase of the common cold.  Lower respiratory infections, such as pneumonia or bronchitis, may develop following a cold.  Infection of the middle ear, or otitis media, can accompany or follow a cold.  COMMON COLD TREATMENT - There is no specific treatment for the viruses that cause the common cold. Most treatments are aimed at relieving some of the symptoms of the cold, but do not shorten or cure the cold. Antibiotics are not useful for treating the  common cold; antibiotics are only used to treat illnesses caused by bacteria, not viruses. Unnecessary use of antibiotics for the treatment of the common cold can cause allergic reactions, diarrhea, or other gastrointestinal symptoms in some patients.  The symptoms of a cold will resolve over time, even without any treatment. People with underlying medical conditions and those who use other over-the-counter or prescription medications should speak with their healthcare provider or pharmacist to ensure that it is safe to use these treatments. The following are treatments that may reduce the symptoms caused by the common cold.  Nasal congestion - Decongestants are good for nasal congestion- if you feel very stuffy but no mucus is coming out, this is the medication that will help you the most.  Pseudoephedrine is a decongestant that can improve nasal congestion. Although a prescription is not required, drugstores in the [place] keep pseudoephedrine behind the counter, so it must be requested from a pharmacist. If you have a heart condition or high blood pressure please use Coricidin BPH instead.   Runny nose - Antihistamines such as diphenhydramine (Benadryl), certazine (Zyrtec) which are best taking at night because they can make you tired OR loratadine (Claritin),  fexafinadine (Allegra) help with a runny nose.   Nasal sprays such an oxymetazoline (Afrin and others) may also give temporary relief of nasal congestion. However, these sprays should never be used for more than two to three days; use for more than three days use can worsen congestion.    Nasocort is now over the counter and can help decrease a runny nose. Please stop the medication if you have blurry vision or nose bleeds.   Sore throat and headache - Sore throat and headache are best treated with a mild pain reliever such as acetaminophen (Tylenol) or a non-steroidal anti-inflammatory agent such as ibuprofen or naproxen (Motrin or Aleve).  These medications should be taken with food to prevent stomach problems. As well as gargling with warm water and salt.   Cough - Common cough medicine ingredients include guaifenesin and dextromethorphan; these are often combined with other medications in over-the-counter cold formulas. Often a cough is worse at night or first in the morning due to post nasal drip from you nose. You can try to sleep at an angle to decrease a cough.   Alternative treatments - Heated, humidified air can improve symptoms of nasal congestion and runny nose, and causes few to no side effects. A number of alternative products, including vitamin C, doubling up on your vitamin D and herbal products such as echinacea, may help. Certain products, such as nasal gels that contain zinc (eg, Zicam), have been associated with a permanent loss of smell.  Antibiotics - Antibiotics should not be used to treat an uncomplicated common cold. As noted above, colds are caused by viruses. Antibiotics treat bacterial, not viral infections. Some viruses that cause the common cold can also depress the immune system or cause swelling in the lining of the nose or airways; this can, in turn, lead to a bacterial infection. Often you need to give your body 7 days to fight off a common cold while treating the symptoms with the medications listed above. If after 7 days your symptoms are not improving, you are getting worse, you have shortness of breath, chest pain, a fever of over 103 you should seek medical help immediately.   PREVENTION IS THE BEST MEDICINE - Hand washing is an essential and highly effective way to prevent the spread of infection.  Alcohol-based hand rubs are a good alternative for disinfecting hands if a sink is not available.  Hands should be washed before preparing food and eating and after coughing, blowing the nose, or sneezing. While it is not always possible to limit contact with people who may be infected with a cold, touching  the eyes, nose, or mouth after direct contact should be avoided when possible. Sneezing/coughing into the sleeve of one's clothing (at the inner elbow) is another means of containing sprays of saliva and secretions and does not contaminate the hands.    What is the TMJ? The temporomandibular (tem-PUH-ro-man-DIB-yoo-ler) joint, or the TMJ, connects the upper and lower jawbones. This joint allows the jaw to open wide and move back and forth when you chew, talk, or yawn.There are also several muscles that help this joint move. There can be muscle tightness and pain in the muscle that can cause several symptoms.  What causes TMJ pain? There are many causes of TMJ pain. Repeated chewing (for example, chewing gum) and clenching your teeth can cause pain in the joint. Some TMJ pain has no obvious cause. What can I do to ease the pain? There are many things you can do to help your pain get better. When you have pain:  Eat soft foods and stay away from chewy foods (for example, taffy) Try to use both sides of your mouth to chew Don't chew gum Don't open your mouth wide (for example, during yawning or singing) Don't bite your  cheeks or fingernails Lower your amount of stress and worry Applying a warm, damp washcloth to the joint may help. Over-the-counter pain medicines such as ibuprofen (one brand: Advil) or acetaminophen (one brand: Tylenol) might also help. Do not use these medicines if you are allergic to them or if your doctor told you not to use them. How can I stop the pain from coming back? When your pain is better, you can do these exercises to make your muscles stronger and to keep the pain from coming back:  Resisted mouth opening: Place your thumb or two fingers under your chin and open your mouth slowly, pushing up lightly on your chin with your thumb. Hold for three to six seconds. Close your mouth slowly. Resisted mouth closing: Place your thumbs under your chin and your two index fingers  on the ridge between your mouth and the bottom of your chin. Push down lightly on your chin as you close your mouth. Tongue up: Slowly open and close your mouth while keeping the tongue touching the roof of the mouth. Side-to-side jaw movement: Place an object about one fourth of an inch thick (for example, two tongue depressors) between your front teeth. Slowly move your jaw from side to side. Increase the thickness of the object as the exercise becomes easier Forward jaw movement: Place an object about one fourth of an inch thick between your front teeth and move the bottom jaw forward so that the bottom teeth are in front of the top teeth. Increase the thickness of the object as the exercise becomes easier. These exercises should not be painful. If it hurts to do these exercises, stop doing them and talk to your family doctor.    

## 2013-05-16 ENCOUNTER — Encounter: Payer: 59 | Admitting: Women's Health

## 2013-06-03 ENCOUNTER — Encounter: Payer: Self-pay | Admitting: Women's Health

## 2013-06-03 ENCOUNTER — Ambulatory Visit (INDEPENDENT_AMBULATORY_CARE_PROVIDER_SITE_OTHER): Payer: 59 | Admitting: Women's Health

## 2013-06-03 ENCOUNTER — Other Ambulatory Visit (HOSPITAL_COMMUNITY)
Admission: RE | Admit: 2013-06-03 | Discharge: 2013-06-03 | Disposition: A | Discharge status: Home / Self Care | Payer: 59 | Source: Ambulatory Visit | Attending: Gynecology | Admitting: Gynecology

## 2013-06-03 VITALS — BP 116/74 | Ht 63.0 in | Wt 148.0 lb

## 2013-06-03 DIAGNOSIS — Z01419 Encounter for gynecological examination (general) (routine) without abnormal findings: Secondary | ICD-10-CM

## 2013-06-03 DIAGNOSIS — N920 Excessive and frequent menstruation with regular cycle: Secondary | ICD-10-CM

## 2013-06-03 MED ORDER — IBUPROFEN 600 MG PO TABS: 600.0000 mg | ORAL_TABLET | Freq: Three times a day (TID) | ORAL | Status: DC | PRN

## 2013-06-03 NOTE — Progress Notes (Signed)
Anna Hernandez 08-Feb-1971 [id number]    History:    Presents for annual exam.  Monthly cycles/BTL. 2001 ascus with negative colposcopy normal Paps after. Normal Pap history. Normal labs (CBC, Lipid panel, hgb A1c, TSH) at primary care other than vitamin D being slightly low. 2013 melanoma right shoulder.  Past medical history, past surgical history, family history and social history were all reviewed and documented in the EPIC chart. Works for the city of [place]. [place] 15, [place] 9, [place] and [place] 7, all doing well. Mother hypertension. Husband work related back injury surgery slow recovery.  ROS:  A  ROS was performed and pertinent positives and negatives are included.  Exam:  Filed Vitals:   06/03/13 1510  BP: 116/74    General appearance:  Normal Thyroid:  Symmetrical, normal in size, without palpable masses or nodularity. Respiratory  Auscultation:  Clear without wheezing or rhonchi Cardiovascular  Auscultation:  Regular rate, without rubs, murmurs or gallops  Edema/varicosities:  Not grossly evident Abdominal  Soft,nontender, without masses, guarding or rebound.  Liver/spleen:  No organomegaly noted  Hernia:  None appreciated  Skin  Inspection:  Grossly normal   Breasts: Examined lying and sitting.     Right: Without masses, retractions, discharge or axillary adenopathy.     Left: Without masses, retractions, discharge or axillary adenopathy. Gentitourinary   Inguinal/mons:  Normal without inguinal adenopathy  External genitalia:  Normal  BUS/Urethra/Skene's glands:  Normal  Vagina:  Normal  Cervix:  Normal  Uterus:   normal in size, shape and contour.  Midline and mobile  Adnexa/parametria:     Rt: Without masses or tenderness.   Lt: Without masses or tenderness.  Anus and perineum: Normal  Digital rectal exam: Normal sphincter tone without palpated masses or tenderness  Assessment/Plan:  43 y.o. MWF G3P4 for annual exam with no complaints.  Normal  GYN exam/BTL 2013 melanoma  Plan: SBE's, continue annual mammogram, overdue instructed to schedule. Continue active lifestyle, healthy diet, vitamin D 2000 daily encouraged. UA, Pap. Pap normal 09/2010, new screening guidelines reviewed. Continue annual skin check with dermatologist.   [facility], 4:23 PM 06/03/2013

## 2013-06-03 NOTE — Patient Instructions (Signed)

## 2013-06-04 LAB — URINALYSIS W MICROSCOPIC + REFLEX CULTURE
BACTERIA UA: NONE SEEN
Bilirubin Urine: NEGATIVE
CASTS: NONE SEEN
CRYSTALS: NONE SEEN
GLUCOSE, UA: NEGATIVE mg/dL
Hgb urine dipstick: NEGATIVE
Ketones, ur: NEGATIVE mg/dL
Leukocytes, UA: NEGATIVE
Nitrite: NEGATIVE
PH: 6 (ref 5.0–8.0)
PROTEIN: NEGATIVE mg/dL
SPECIFIC GRAVITY, URINE: 1.012 (ref 1.005–1.030)
Squamous Epithelial / LPF: NONE SEEN
UROBILINOGEN UA: 0.2 mg/dL (ref 0.0–1.0)

## 2013-06-30 ENCOUNTER — Encounter: Payer: Self-pay | Admitting: Women's Health

## 2013-07-03 ENCOUNTER — Encounter: Payer: Self-pay | Admitting: Women's Health

## 2013-12-20 ENCOUNTER — Ambulatory Visit (INDEPENDENT_AMBULATORY_CARE_PROVIDER_SITE_OTHER): Payer: 59 | Admitting: Emergency Medicine

## 2013-12-20 ENCOUNTER — Encounter: Payer: Self-pay | Admitting: Emergency Medicine

## 2013-12-20 VITALS — BP 116/70 | HR 92 | Temp 100.0°F | Resp 18 | Ht 64.0 in | Wt 150.0 lb

## 2013-12-20 DIAGNOSIS — R509 Fever, unspecified: Secondary | ICD-10-CM

## 2013-12-20 DIAGNOSIS — J020 Streptococcal pharyngitis: Secondary | ICD-10-CM

## 2013-12-20 LAB — POCT RAPID STREP A (OFFICE): Rapid Strep A Screen: NEGATIVE

## 2013-12-20 MED ORDER — AZITHROMYCIN 250 MG PO TABS: ORAL_TABLET | ORAL | Status: DC

## 2013-12-20 MED ORDER — MAGIC MOUTHWASH W/LIDOCAINE: 5.0000 mL | Freq: Three times a day (TID) | ORAL | Status: DC | PRN

## 2013-12-20 MED ORDER — TRIAMCINOLONE ACETONIDE 0.1 % MT PSTE: 1.0000 "application " | PASTE | Freq: Two times a day (BID) | OROMUCOSAL | Status: DC

## 2013-12-20 NOTE — Patient Instructions (Signed)
Warm salt water gargles daily. 1 tsp liquid benadryl + 1 tsp liquid Maalox, MIX/ GARGLE/ SPIT as needed for pain. REMINDER TYLENOL/ ADVIL alternate every 4 hours x 2 days   Strep Throat Strep throat is an infection of the throat. It is caused by a germ. Strep throat spreads from person to person by coughing, sneezing, or close contact. HOME CARE  Rinse your mouth (gargle) with warm salt water (1 teaspoon salt in 1 cup of water). Do this 3 to 4 times per day or as needed for comfort.  Family members with a sore throat or fever should see a doctor.  Make sure everyone in your house washes their hands well.  Do not share food, drinking cups, or personal items.  Eat soft foods until your sore throat gets better.  Drink enough water and fluids to keep your pee (urine) clear or pale yellow.  Rest.  Stay home from school, daycare, or work until you have taken medicine for 24 hours.  Only take medicine as told by your doctor.  Take your medicine as told. Finish it even if you start to feel better. GET HELP RIGHT AWAY IF:   You have new problems, such as throwing up (vomiting) or bad headaches.  You have a stiff or painful neck, chest pain, trouble breathing, or trouble swallowing.  You have very bad throat pain, drooling, or changes in your voice.  Your neck puffs up (swells) or gets red and tender.  You have a fever.  You are very tired, your mouth is dry, or you are peeing less than normal.  You cannot wake up completely.  You get a rash, cough, or earache.  You have green, yellow-brown, or bloody spit.  Your pain does not get better with medicine. MAKE SURE YOU:   Understand these instructions.  Will watch your condition.  Will get help right away if you are not doing well or get worse. Document Released: 09/10/2007 Document Revised: 06/16/2011 Document Reviewed: 05/23/2010 [facility] Patient Information 2015 [place], [place]. This information is not intended to replace  advice given to you by your health care provider. Make sure you discuss any questions you have with your health care provider.

## 2013-12-20 NOTE — Progress Notes (Signed)
   Subjective:    Patient ID: Anna Hernandez, female    DOB: Nov 11, 1970, 43 y.o.   MRN: [id number]  HPI Comments: 43 yo WF with canker sores in mouth times 3 days, increasing inseverity.  She also has left ear pain and sore throat. Positive exposure to Strep with kids and sick husband. She now has had fever >100 x 2 days and increased arthralgias/ fatigue.   Fever  Associated symptoms include a sore throat.  Mouth Lesions  Associated symptoms include a fever, mouth sores and sore throat.  Sore Throat  Associated symptoms include ear pain.  Otalgia  Associated symptoms include a sore throat.   Current Outpatient Prescriptions on File Prior to Visit  Medication Sig Dispense Refill  . Fexofenadine HCl (ALLEGRA PO) Take by mouth.        . fluticasone (FLONASE) 50 MCG/ACT nasal spray Place 2 sprays into both nostrils daily.  16 g  3  . ibuprofen (ADVIL,MOTRIN) 600 MG tablet Take 1 tablet (600 mg total) by mouth every 8 (eight) hours as needed.  60 tablet  1   No current facility-administered medications on file prior to visit.   No Known Allergies Past Medical History  Diagnosis Date  . Allergy   . Melanoma 2013    L shoulder     Review of Systems  Constitutional: Positive for fever and fatigue.  HENT: Positive for mouth sores and sore throat.   Musculoskeletal: Positive for arthralgias.  All other systems reviewed and are negative.  BP 116/70  Pulse 92  Temp(Src) 100 F (37.8 C) (Temporal)  Resp 18  Ht 5\' 4"  (1.626 m)  Wt 150 lb (68.04 kg)  BMI 25.73 kg/m2  LMP 12/06/2013     Objective:   Physical Exam  Nursing note and vitals reviewed. Constitutional: She is oriented to person, place, and time. She appears well-developed and well-nourished.  HENT:  Head: Normocephalic and atraumatic.  Right Ear: External ear normal.  Left Ear: External ear normal.  Nose: Nose normal.  Yellow TMs bilateral, Mouth several 2-4 mm ulcers on left tongue and around gum line/  inner lips  Eyes: Conjunctivae and EOM are normal.  Neck: Normal range of motion.  Cardiovascular: Normal rate, regular rhythm, normal heart sounds and intact distal pulses.   Pulmonary/Chest: Effort normal and breath sounds normal.  Abdominal: Soft. Bowel sounds are normal. There is no tenderness.  Musculoskeletal: Normal range of motion.  Lymphadenopathy:    She has cervical adenopathy.  Neurological: She is alert and oriented to person, place, and time.  Skin: Skin is warm and dry.  Psychiatric: She has a normal mood and affect. Judgment normal.     Rapid Strep NEG    Assessment & Plan:  Streptococcal sore throat - Plan: POCT rapid strep A, triamcinolone (KENALOG) 0.1 % paste, Alum & Mag Hydroxide-Simeth (MAGIC MOUTHWASH W/LIDOCAINE) SOLN, azithromycin (ZITHROMAX) 250 MG tablet  Fever, unspecified - Plan: POCT rapid strep A  Nasonex SX 1 spray ea nostril QD x 3-5 days. Warm salt water gargles daily. 1 tsp liquid benadryl + 1 tsp liquid Maalox, MIX/ GARGLE/ SPIT as needed for pain. Advised possible viral illness but with + strep exposure advise ZPAK. Increase H2o. OOW X 2 days. w/c if SX increase or ER.

## 2014-02-06 ENCOUNTER — Encounter: Payer: Self-pay | Admitting: Emergency Medicine

## 2014-03-10 ENCOUNTER — Ambulatory Visit (INDEPENDENT_AMBULATORY_CARE_PROVIDER_SITE_OTHER): Payer: 59 | Admitting: Physician Assistant

## 2014-03-10 ENCOUNTER — Other Ambulatory Visit: Payer: Self-pay | Admitting: Internal Medicine

## 2014-03-10 VITALS — BP 104/80 | HR 72 | Temp 98.6°F | Resp 16 | Ht 64.0 in | Wt 148.0 lb

## 2014-03-10 DIAGNOSIS — K1379 Other lesions of oral mucosa: Secondary | ICD-10-CM

## 2014-03-10 DIAGNOSIS — J309 Allergic rhinitis, unspecified: Secondary | ICD-10-CM

## 2014-03-10 DIAGNOSIS — J029 Acute pharyngitis, unspecified: Secondary | ICD-10-CM

## 2014-03-11 ENCOUNTER — Encounter: Payer: Self-pay | Admitting: Physician Assistant

## 2014-03-11 DIAGNOSIS — K1379 Other lesions of oral mucosa: Secondary | ICD-10-CM

## 2014-03-11 LAB — VITAMIN B12: Vitamin B-12: 202 pg/mL — ABNORMAL LOW (ref 211–911)

## 2014-03-11 NOTE — Addendum Note (Signed)
Addended by: Charolette Forward on: 03/11/2014 10:46 AM   Modules accepted: Miquel Dunn

## 2014-03-11 NOTE — Progress Notes (Signed)
Subjective:    Patient ID: Anna Hernandez, female    DOB: [birth year], 43 y.o.   MRN: [id number]  HPI Comments: EP[employer] EMR was down on 03/10/14 all day.  This note was written 03/11/14 at 10:00am.  Otalgia  There is pain in the left ear. This is a new problem. The current episode started yesterday. The problem occurs constantly. The problem has been gradually worsening. There has been no fever. The pain is mild. Associated symptoms include a sore throat. Pertinent negatives include no abdominal pain, coughing, diarrhea, ear discharge, headaches, neck pain, rash or vomiting. Treatments tried: Tylenol, Allegra, and sinus allergy pill (does not know name) The treatment provided mild relief.  Sore Throat  This is a new problem. Episode onset: 4 days ago. Associated symptoms include congestion and ear pain. Pertinent negatives include no abdominal pain, coughing, diarrhea, ear discharge, headaches, neck pain, stridor, swollen glands, trouble swallowing or vomiting.  Mouth Lesions  Episode onset: 4 days ago. The onset is undetermined. The problem occurs occasionally. The problem has been gradually worsening. The problem is moderate. Nothing relieves the symptoms. The symptoms are aggravated by eating and drinking. Associated symptoms include congestion, ear pain, mouth sores, sore throat, muscle aches and URI. Pertinent negatives include no orthopnea, no fever, no eye itching, no photophobia, no abdominal pain, no constipation, no diarrhea, no nausea, no vomiting, no ear discharge, no headaches, no stridor, no swollen glands, no neck pain, no neck stiffness, no cough, no wheezing, no rash, no eye discharge, no eye pain and no eye redness. Associated symptoms comments: Patient does admit to having oral intercourse with husband last weekend and a couple months ago.. The sore throat is characterized by pain only. The ear pain is mild. There is pain in the left ear. There is no abnormality behind the ear.  GFR=  >89 on 03/21/13 History   Social History  . Marital Status: Married    Spouse Name: N/A    Number of Children: N/A  . Years of Education: N/A   Occupational History  . Not on file.   Social History Main Topics  . Smoking status: Former Smoker    Quit date: 03/21/1998  . Smokeless tobacco: Never Used  . Alcohol Use: Yes     Comment: sometimes  . Drug Use: No  . Sexual Activity: Yes    Birth Control/ Protection: Surgical     Comment: BTL   Other Topics Concern  . Not on file   Social History Narrative   Review of Systems  Constitutional: Positive for fatigue. Negative for fever, chills and diaphoresis.  HENT: Positive for congestion, ear pain, mouth sores, postnasal drip and sore throat. Negative for ear discharge, sinus pressure, trouble swallowing and voice change.   Eyes: Negative.  Negative for photophobia, pain, discharge, redness and itching.  Respiratory: Negative.  Negative for cough, wheezing and stridor.   Cardiovascular: Negative.  Negative for orthopnea.  Gastrointestinal: Negative.  Negative for nausea, vomiting, abdominal pain, diarrhea and constipation.  Genitourinary: Negative.   Musculoskeletal: Negative for neck pain.       Body aches  Skin: Negative.  Negative for rash.  Allergic/Immunologic: Positive for environmental allergies.  Neurological: Negative.  Negative for dizziness, light-headedness and headaches.   Past Medical History  Diagnosis Date  . Allergy   . Melanoma 2013    L shoulder    Current Outpatient Prescriptions on File Prior to Visit  Medication Sig Dispense Refill  . Fexofenadine HCl (ALLEGRA PO) Take by  mouth.      . fluticasone (FLONASE) 50 MCG/ACT nasal spray Place 2 sprays into both nostrils daily. 16 g 3  . ibuprofen (ADVIL,MOTRIN) 600 MG tablet Take 1 tablet (600 mg total) by mouth every 8 (eight) hours as needed. 60 tablet 1  . triamcinolone (KENALOG) 0.1 % paste Use as directed 1 application in the mouth or throat 2 (two)  times daily. 5 g 12   No current facility-administered medications on file prior to visit.   No Known Allergies    BP 104/80 mmHg  Pulse 72  Temp(Src) 98.6 F (37 C)  Resp 16  Ht 5\' 4"  (1.626 m)  Wt 148 lb (67.132 kg)  BMI 25.39 kg/m2  LMP 03/07/2014 (Exact Date) Wt Readings from Last 3 Encounters:  03/10/14 148 lb (67.132 kg)  12/20/13 150 lb (68.04 kg)  06/03/13 148 lb (67.132 kg)   Objective:   Physical Exam  Constitutional: She is oriented to person, place, and time. She appears well-developed and well-nourished. She has a sickly appearance. No distress.  HENT:  Head: Normocephalic.  Right Ear: External ear and ear canal normal. Tympanic membrane is bulging. Tympanic membrane is not injected, not scarred, not perforated, not erythematous and not retracted.  Left Ear: External ear and ear canal normal. Tympanic membrane is bulging. Tympanic membrane is not injected, not scarred, not perforated, not erythematous and not retracted.  Nose: Mucosal edema and rhinorrhea present. Right sinus exhibits no maxillary sinus tenderness and no frontal sinus tenderness. Left sinus exhibits no maxillary sinus tenderness and no frontal sinus tenderness.  Mouth/Throat: Uvula is midline and mucous membranes are normal. Mucous membranes are not pale, not dry and not cyanotic. She does not have dentures. Oral lesions present. No trismus in the jaw. No dental abscesses, uvula swelling or lacerations. Posterior oropharyngeal erythema present. No oropharyngeal exudate, posterior oropharyngeal edema or tonsillar abscesses.    Bulging TMs bilaterally with clear fluid. Turbinates erythematous bilaterally.   Eyes: Conjunctivae and lids are normal. Pupils are equal, round, and reactive to light. Right eye exhibits no discharge. Left eye exhibits no discharge. No scleral icterus.  Neck: Trachea normal, normal range of motion and phonation normal. Neck supple. No tracheal deviation present.  Cardiovascular:  Normal rate, regular rhythm, S1 normal, S2 normal, normal heart sounds and normal pulses.  Exam reveals no gallop, no distant heart sounds and no friction rub.   No murmur heard. Pulmonary/Chest: Effort normal and breath sounds normal. No stridor. No respiratory distress. She has no decreased breath sounds. She has no wheezes. She has no rhonchi. She has no rales. She exhibits no tenderness.  Abdominal: Soft. Bowel sounds are normal. There is no tenderness. There is no rebound and no guarding.  Lymphadenopathy:  No tenderness or LAD.  Neurological: She is alert and oriented to person, place, and time. Gait normal.  Skin: Skin is warm, dry and intact. No rash noted. She is not diaphoretic.  Psychiatric: She has a normal mood and affect. Her speech is normal and behavior is normal. Judgment and thought content normal. Cognition and memory are normal.  Vitals reviewed.  Assessment & Plan:  1. Acute pharyngitis, unspecified pharyngitis type Gave patient paper prescription for Z-Pak- #1- Take as directed- No refills. Gave patient paper prescription for Prednisone 20mg - #18- Take 1 tablet PO TID x 3 days, then take 1 tablet PO BID x 3 days, then take 1 tablet PO QDaily x 3 days.- No refills.  2. Allergic Rhinitis, unspecified. Continue Allegra  OTC- 1 tablet daily.  3. Mouth sore Told patient to use mouth wash with hydrogen peroxide twice a day- Swish and spit. Told patient she could also use OralGel for the pain. Told patient that if the mouth ulcers do not heal, to go to her dentist to get tested for oral cancer due to her history of smoking.   Ordered Herpes I and II to R/O cause of mouth ulcers. Ordered B12 and Folate levels to R/O cause of mouth ulcers.  Will message patient on MyChart with lab results.  Pt agreed to receive results on MyChart. Discussed medication effects and SE's.  Pt agreed to treatment plan. If you are not feeling better in 10-14 days, then please call the  office.  Makaio Mach, Stephani Police, PA-C 10:00am [facility]

## 2014-03-12 LAB — FOLATE RBC: RBC Folate: 1181 ng/mL (ref >280)

## 2014-03-14 LAB — HSV(HERPES SIMPLEX VRS) I + II AB-IGM: HERPES SIMPLEX VRS I-IGM AB (EIA): 0.7 {index}

## 2014-03-15 ENCOUNTER — Ambulatory Visit: Payer: Self-pay

## 2014-03-15 ENCOUNTER — Other Ambulatory Visit: Payer: Self-pay | Admitting: *Deleted

## 2014-03-15 MED ORDER — CYANOCOBALAMIN 1000 MCG/ML IJ SOLN: 1000.0000 ug | Freq: Once | INTRAMUSCULAR | Status: DC

## 2014-03-16 ENCOUNTER — Ambulatory Visit: Payer: Self-pay

## 2014-03-16 ENCOUNTER — Ambulatory Visit (INDEPENDENT_AMBULATORY_CARE_PROVIDER_SITE_OTHER): Payer: 59 | Admitting: *Deleted

## 2014-03-16 DIAGNOSIS — E538 Deficiency of other specified B group vitamins: Secondary | ICD-10-CM

## 2014-03-16 MED ORDER — CYANOCOBALAMIN 1000 MCG/ML IJ SOLN
1000.0000 ug | Freq: Once | INTRAMUSCULAR | Status: AC
  Administered 2014-03-16: 1000 ug via INTRAMUSCULAR

## 2014-03-16 NOTE — Progress Notes (Signed)
Patient ID: Anna Hernandez, female   DOB: 1970/04/23, 43 y.o.   MRN: [id number] Patient presents for Vitamin B injection for vitamin B def.  Patient received 1 ml Sub Q upper right arm.  Per Dr. Idell Pickles orders patient was advised to d/c shots and  start Sublingual B 12 daily and recheck lab only Vitamin B in one month.  Patient states she will determine after that one month of trying the sublingual if she wants to continue the OTC subling. B12 or restart shots instead.

## 2014-03-22 ENCOUNTER — Encounter: Payer: Self-pay | Admitting: Emergency Medicine

## 2014-03-24 ENCOUNTER — Encounter: Payer: Self-pay | Admitting: Emergency Medicine

## 2014-03-24 ENCOUNTER — Encounter: Payer: Self-pay | Admitting: Physician Assistant

## 2014-03-24 ENCOUNTER — Ambulatory Visit (INDEPENDENT_AMBULATORY_CARE_PROVIDER_SITE_OTHER): Payer: 59 | Admitting: Emergency Medicine

## 2014-03-24 VITALS — BP 104/66 | HR 68 | Temp 98.2°F | Resp 16 | Ht 63.5 in | Wt 148.0 lb

## 2014-03-24 DIAGNOSIS — Z0000 Encounter for general adult medical examination without abnormal findings: Secondary | ICD-10-CM

## 2014-03-24 DIAGNOSIS — Z1212 Encounter for screening for malignant neoplasm of rectum: Secondary | ICD-10-CM

## 2014-03-24 DIAGNOSIS — Z111 Encounter for screening for respiratory tuberculosis: Secondary | ICD-10-CM

## 2014-03-24 DIAGNOSIS — Z23 Encounter for immunization: Secondary | ICD-10-CM

## 2014-03-24 LAB — CBC WITH DIFFERENTIAL/PLATELET
BASOS PCT: 0 % (ref 0–1)
Basophils Absolute: 0 10*3/uL (ref 0.0–0.1)
EOS ABS: 0.1 10*3/uL (ref 0.0–0.7)
EOS PCT: 2 % (ref 0–5)
HCT: 34.6 % — ABNORMAL LOW (ref 36.0–46.0)
HEMOGLOBIN: 11.9 g/dL — AB (ref 12.0–15.0)
LYMPHS ABS: 1.7 10*3/uL (ref 0.7–4.0)
Lymphocytes Relative: 33 % (ref 12–46)
MCH: 29.2 pg (ref 26.0–34.0)
MCHC: 34.4 g/dL (ref 30.0–36.0)
MCV: 85 fL (ref 78.0–100.0)
MPV: 9.8 fL (ref 9.4–12.4)
Monocytes Absolute: 0.4 10*3/uL (ref 0.1–1.0)
Monocytes Relative: 7 % (ref 3–12)
NEUTROS PCT: 58 % (ref 43–77)
Neutro Abs: 3 10*3/uL (ref 1.7–7.7)
PLATELETS: 298 10*3/uL (ref 150–400)
RBC: 4.07 MIL/uL (ref 3.87–5.11)
RDW: 13.6 % (ref 11.5–15.5)
WBC: 5.1 10*3/uL (ref 4.0–10.5)

## 2014-03-24 LAB — HEMOGLOBIN A1C
Hgb A1c MFr Bld: 5.3 % (ref <5.7)
MEAN PLASMA GLUCOSE: 105 mg/dL (ref <117)

## 2014-03-24 NOTE — Progress Notes (Signed)
Subjective:    Patient ID: Anna Hernandez, female    DOB: [birth year], 43 y.o.   MRN: [id number]  HPI Comments: 43 yo pleasant WF CPE. She has not been eating healthy or exercising routinely with busy schedule. She notes she has had trouble sleeping this week and has since had increased afternoon fatigue. She did start B12 at last visit which may have helped some.   She has had her yearly DERM evaluation 10/2013 with Melanoma on Left shoulder 2013. She was just released from Q 6 month checks to yearly. She has noticed an occasionally swollen lymph node in her left axilla. She had mammo this year and WNL. She is not aware of any trigger or relation to menstrual cycles the node may have. She notes it is painless when occurs.  Lab Results      Component                Value               Date                      WBC                      7.6                 03/21/2013                HGB                      12.7                03/21/2013                HCT                      37.1                03/21/2013                PLT                      271                 03/21/2013                GLUCOSE                  87                  03/21/2013                CHOL                     154                 03/21/2013                TRIG                     99                  03/21/2013                HDL  54                  03/21/2013                LDLCALC                  80                  03/21/2013                ALT                      10                  03/21/2013                AST                      15                  03/21/2013                NA                       137                 03/21/2013                K                        4.0                 03/21/2013                CL                       105                 03/21/2013                CREATININE               0.65                03/21/2013                BUN                      12                   03/21/2013                CO2                      23                  03/21/2013                TSH                      0.761               03/21/2013                HGBA1C                   5.3  03/21/2013            B12 202    Medication List       This list is accurate as of: 03/24/14 11:59 PM.  Always use your most recent med list.               ALLEGRA PO  Take by mouth.     fluticasone 50 MCG/ACT nasal spray  Commonly known as:  FLONASE  Place 2 sprays into both nostrils daily.     ibuprofen 600 MG tablet  Commonly known as:  ADVIL,MOTRIN  Take 1 tablet (600 mg total) by mouth every 8 (eight) hours as needed.     triamcinolone 0.1 % paste  Commonly known as:  KENALOG  Use as directed 1 application in the mouth or throat 2 (two) times daily.     Vitamin B-12 1000 MCG Subl  Place under the tongue daily.       No Known Allergies    Past Medical History  Diagnosis Date  . Allergy   . B12 deficiency anemia   . Melanoma 2013    L shoulder     Past Surgical History  Procedure Laterality Date  . Cesarean section      LEFT OVARIAN ENDOMETRIOMA AND BTSP  . Tubal ligation      AT TIME OF C/S  . Melanoma excised     History  Substance Use Topics  . Smoking status: Former Smoker    Quit date: 03/21/1998  . Smokeless tobacco: Never Used  . Alcohol Use: Yes     Comment: sometimes    Family History  Problem Relation Age of Onset  . Hypertension Mother   . Stroke Mother     TIA  . Hypertension Father   . Diabetes Father   . Cancer Maternal Grandmother     OVARIAN/ leukemia  . Heart disease Maternal Grandfather   . Cancer Paternal Grandmother     Brain tumor  . Cancer Paternal Grandfather     Lung       MAINTENANCE: Colonoscopy:n/a Mammo:06/29/13 WNL Pap/ Pelvic:06/03/2013 IRJ:[id number], WNL 03/07/14 Dentist: Q 6 month WNL, Summer 2015 DERM: 10/2013 (yearly now with Melanoma  HX) LMP:03/07/14  IMMUNIZATIONS: Td:2005, TDAP 03/24/14 Pneumovax:n/a Zostavax:n/a Influenza:2015 at work 01/2014  Patient Care Team: [name], MD as PCP - General (Internal Medicine) [name], FNP as Nurse Practitioner (Obstetrics and Gynecology) [name], MD as Consulting Physician (Dermatology) [name], MD as Referring Physician (Allergy and Immunology)  [name], (Dentist) [facility] [address])   Review of Systems  Constitutional: Positive for fatigue.  Respiratory: Negative for shortness of breath.   Cardiovascular: Negative for chest pain.  Neurological: Negative for numbness (al.pmh).  All other systems reviewed and are negative.  BP 104/66 mmHg  Pulse 68  Temp(Src) 98.2 F (36.8 C) (Temporal)  Resp 16  Ht 5' 3.5" (1.613 m)  Wt 148 lb (67.132 kg)  BMI 25.80 kg/m2  LMP 03/07/2014 (Exact Date)     Objective:   Physical Exam  Constitutional: She is oriented to person, place, and time. She appears well-developed and well-nourished. No distress.  HENT:  Head: Normocephalic and atraumatic.  Right Ear: External ear normal.  Left Ear: External ear normal.  Nose: Nose normal.  Mouth/Throat: Oropharynx is clear and moist.  Cloudy TM's bilaterally   Eyes: Conjunctivae and EOM are normal. Pupils are equal, round, and reactive to light. Right eye exhibits no discharge. Left eye exhibits no discharge. No scleral  icterus.  Neck: Normal range of motion. Neck supple. No JVD present. No tracheal deviation present. No thyromegaly present.  Cardiovascular: Normal rate, regular rhythm, normal heart sounds and intact distal pulses.   Pulmonary/Chest: Effort normal and breath sounds normal.  Abdominal: Soft. Bowel sounds are normal. She exhibits no distension and no mass. There is no tenderness. There is no rebound and no guarding.  Genitourinary:  Def gyn. No obvious left axillary lymph node seated or erect positioning on exam  Musculoskeletal: Normal range  of motion. She exhibits no edema or tenderness.  Lymphadenopathy:    She has no cervical adenopathy.  Neurological: She is alert and oriented to person, place, and time. She has normal reflexes. No cranial nerve deficit. She exhibits normal muscle tone. Coordination normal.  Skin: Skin is warm and dry. No rash noted. No erythema. No pallor.  Full at [facility]. Multiple flat medium brown nevi over back/ arms/ chest no obvious melanoma  Psychiatric: She has a normal mood and affect. Her behavior is normal. Judgment and thought content normal.  Nursing note and vitals reviewed.        EKG NSCSPT WNL  Assessment & Plan:  1. CPE- Update screening labs/ History/ Immunizations/ Testing as needed. Advised healthy diet, QD exercise, increase H20 and continue RX/ Vitamins AD.  2. Fatigue- check labs, increase activity and H2O, increase protein. If no change call for sleep study appointment.  3. Axillary lymph node left enlargement (not present today) vs reactive node with cycles-With Melanoma history of left shoulder needs close monitoring. If reoccurs call for diagnostic mammo imaging, if negative may need u/s vs CT CHest for further evaluation.   4. Allergic rhinitis- Allegra OTC, increase H2o, allergy hygiene explained.

## 2014-03-24 NOTE — Progress Notes (Deleted)
Subjective:    Patient ID: Anna Hernandez, female    DOB: Jun 02, 1970, 43 y.o.   MRN: [id number]  HPI Comments: She has not been exercising as much or eating healthy.    Lab Results      Component                Value               Date                      WBC                      7.6                 03/21/2013                HGB                      12.7                03/21/2013                HCT                      37.1                03/21/2013                PLT                      271                 03/21/2013                GLUCOSE                  87                  03/21/2013                CHOL                     154                 03/21/2013                TRIG                     99                  03/21/2013                HDL                      54                  03/21/2013                LDLCALC                  80                  03/21/2013                ALT  10                  03/21/2013                AST                      15                  03/21/2013                NA                       137                 03/21/2013                K                        4.0                 03/21/2013                CL                       105                 03/21/2013                CREATININE               0.65                03/21/2013                BUN                      12                  03/21/2013                CO2                      23                  03/21/2013                TSH                      0.761               03/21/2013                HGBA1C                   5.3                 03/21/2013      B12 202           Medication List       This list is accurate as of: 03/24/14 10:49 AM.  Always use your most recent med list.               ALLEGRA PO  Take by mouth.     fluticasone 50 MCG/ACT nasal spray  Commonly known as:  FLONASE  Place 2 sprays into both nostrils daily.     ibuprofen 600  MG tablet  Commonly known as:  ADVIL,MOTRIN  Take 1 tablet (600 mg total) by mouth every 8 (eight) hours as needed.     triamcinolone 0.1 % paste  Commonly known as:  KENALOG  Use as directed 1 application in the mouth or throat 2 (two) times daily.     Vitamin B-12 1000 MCG Subl  Place under the tongue daily.       No Known Allergies  Past Medical History  Diagnosis Date  . Allergy   . Melanoma 2013    L shoulder    Past Surgical History  Procedure Laterality Date  . Cesarean section      LEFT OVARIAN ENDOMETRIOMA AND BTSP  . Tubal ligation      AT TIME OF C/S  . Melanoma excised     History  Substance Use Topics  . Smoking status: Former Smoker    Quit date: 03/21/1998  . Smokeless tobacco: Never Used  . Alcohol Use: Yes     Comment: sometimes   Family History  Problem Relation Age of Onset  . Hypertension Mother   . Stroke Mother     TIA  . Hypertension Father   . Diabetes Father   . Cancer Maternal Grandmother     OVARIAN  . Heart disease Maternal Grandfather   . Cancer Paternal Grandmother     Brain tumor  . Cancer Paternal Grandfather     Lung    MAINTENANCE: Colonoscopy:n/a Mammo:06/29/13 WNL Pap/ Pelvic:06/03/2013 EXB:[id number], WNL 03/07/14 Dentist: Q 6 month WNL, Summer 2015 DERM: 10/2013 (yearly now with Melanoma HX) LMP:03/07/14  IMMUNIZATIONS: Td:2005 Pneumovax:n/a Zostavax:n/a Influenza:2015 at work 01/2014   Review of Systems BP 104/66 mmHg  Pulse 68  Temp(Src) 98.2 F (36.8 C) (Temporal)  Resp 16  Ht 5' 3.5" (1.613 m)  Wt 148 lb (67.132 kg)  BMI 25.80 kg/m2  LMP 03/07/2014 (Exact Date)     Objective:   Physical Exam    EKG NSCSPT WNL      Assessment & Plan:

## 2014-03-24 NOTE — Patient Instructions (Signed)
Tuberculin Skin Test The PPD skin test is a method used to help with the diagnosis of a disease called tuberculosis (TB). HOW THE TEST IS DONE  The test site (usually the forearm) is cleansed. The PPD extract is then injected under the top layer of skin, causing a blister to form on the skin. The reaction will take 48 - 72 hours to develop. You must return to your health care Anna Hernandez within that time to have the area checked. This will determine whether you have had a significant reaction to the PPD test. A reaction is measured in millimeters of hard swelling (induration) at the site. PREPARATION FOR TEST  There is no special preparation for this test. People with a skin rash or other skin irritations on their arms may need to have the test performed at a different spot on the body. Tell your health care Anna Hernandez if you have ever had a positive PPD skin test. If so, you should not have a repeat PPD test. Tell your doctor if you have a medical condition or if you take certain drugs, such as steroids, that can affect your immune system. These situations may lead to inaccurate test results. NORMAL FINDINGS A negative reaction (no induration) or a level of hard swelling that falls below a certain cutoff may mean that a person has not been infected with the bacteria that cause TB. There are different cutoffs for children, people with HIV, and other risk groups. Unfortunately, this is not a perfect test, and up to 20% of people infected with tuberculosis may not have a reaction on the PPD skin test. In addition, certain conditions that affect the immune system (cancer, recent chemotherapy, late-stage AIDS) may cause a false-negative test result.  The reaction will take 48 - 72 hours to develop. You must return to your health care Anna Hernandez within that time to have the area checked. Follow your caregiver's instructions as to where and when to report for this to be done. Ranges for normal findings may vary  among different laboratories and hospitals. You should always check with your doctor after having lab work or other tests done to discuss the meaning of your test results and whether your values are considered within normal limits. WHAT ABNORMAL RESULTS MEAN  The results of the test depend on the size of the skin reaction and on the person being tested.  A small reaction (5 mm of hard swelling at the site) is considered to be positive in people who have HIV, who are taking steroid therapy, or who have been in close contact with a person who has active tuberculosis. Larger reactions (greater than or equal to 10 mm) are considered positive in people with diabetes or kidney failure, and in health care workers, among others. In people with no known risks for tuberculosis, a positive reaction requires 15 mm or more of hard swelling at the site. RISKS AND COMPLICATIONS There is a very small risk of severe redness and swelling of the arm in people who have had a previous positive PPD test and who have the test again. There also have been a few rare cases of this reaction in people who have not been tested before. CONSIDERATIONS  A positive skin test does not necessarily mean that a person has active tuberculosis. More tests will be done to check whether active disease is present. Many people who were born outside the [place] may have had a vaccine called "BCG," which can lead to a false-positive test   result. MEANING OF TEST  Your caregiver will go over the test results with you and discuss the importance and meaning of your results, as well as treatment options and the need for additional tests if necessary. OBTAINING THE TEST RESULTS It is your responsibility to obtain your test results. Ask the lab or department performing the test when and how you will get your results. Document Released: 01/01/2005 Document Revised: 06/16/2011 Document Reviewed: 07/01/2013 [facility] Patient Information 2015  [place], [place]. This information is not intended to replace advice given to you by your health care Anna Hernandez. Make sure you discuss any questions you have with your health care Anna Hernandez. Tdap Vaccine (Tetanus, Diphtheria, Pertussis): What You Need to Know 1. Why get vaccinated? Tetanus, diphtheria and pertussis can be very serious diseases, even for adolescents and adults. Tdap vaccine can protect [place] from these diseases. TETANUS (Lockjaw) causes painful muscle tightening and stiffness, usually all over the body.  It can lead to tightening of muscles in the head and neck so you can't open your mouth, swallow, or sometimes even breathe. Tetanus kills about 1 out of 5 people who are infected. DIPHTHERIA can cause a thick coating to form in the back of the throat.  It can lead to breathing problems, paralysis, heart failure, and death. PERTUSSIS (Whooping Cough) causes severe coughing spells, which can cause difficulty breathing, vomiting and disturbed sleep.  It can also lead to weight loss, incontinence, and rib fractures. Up to 2 in 100 adolescents and 5 in 100 adults with pertussis are hospitalized or have complications, which could include pneumonia or death. These diseases are caused by bacteria. Diphtheria and pertussis are spread from person to person through coughing or sneezing. Tetanus enters the body through cuts, scratches, or wounds. Before vaccines, the [place] saw as many as 200,000 cases a year of diphtheria and pertussis, and hundreds of cases of tetanus. Since vaccination began, tetanus and diphtheria have dropped by about 99% and pertussis by about 80%. 2. Tdap vaccine Tdap vaccine can protect adolescents and adults from tetanus, diphtheria, and pertussis. One dose of Tdap is routinely given at age 74 or 32. People who did not get Tdap at that age should get it as soon as possible. Tdap is especially important for health care professionals and anyone having close contact with a  baby younger than 12 months. Pregnant women should get a dose of Tdap during every pregnancy, to protect the newborn from pertussis. Infants are most at risk for severe, life-threatening complications from pertussis. A similar vaccine, called Td, protects from tetanus and diphtheria, but not pertussis. A Td booster should be given every 10 years. Tdap may be given as one of these boosters if you have not already gotten a dose. Tdap may also be given after a severe cut or burn to prevent tetanus infection. Your doctor can give you more information. Tdap may safely be given at the same time as other vaccines. 3. Some people should not get this vaccine  If you ever had a life-threatening allergic reaction after a dose of any tetanus, diphtheria, or pertussis containing vaccine, OR if you have a severe allergy to any part of this vaccine, you should not get Tdap. Tell your doctor if you have any severe allergies.  If you had a coma, or long or multiple seizures within 7 days after a childhood dose of DTP or DTaP, you should not get Tdap, unless a cause other than the vaccine was found. You can still get  Td.  Talk to your doctor if you:  have epilepsy or another nervous system problem,  had severe pain or swelling after any vaccine containing diphtheria, tetanus or pertussis,  ever had Guillain-Barr Syndrome (GBS),  aren't feeling well on the day the shot is scheduled. 4. Risks of a vaccine reaction With any medicine, including vaccines, there is a chance of side effects. These are usually mild and go away on their own, but serious reactions are also possible. Brief fainting spells can follow a vaccination, leading to injuries from falling. Sitting or lying down for about 15 minutes can help prevent these. Tell your doctor if you feel dizzy or light-headed, or have vision changes or ringing in the ears. Mild problems following Tdap (Did not interfere with activities)  Pain where the shot was  given (about 3 in 4 adolescents or 2 in 3 adults)  Redness or swelling where the shot was given (about 1 person in 5)  Mild fever of at least 100.36F (up to about 1 in 25 adolescents or 1 in 100 adults)  Headache (about 3 or 4 people in 10)  Tiredness (about 1 person in 3 or 4)  Nausea, vomiting, diarrhea, stomach ache (up to 1 in 4 adolescents or 1 in 10 adults)  Chills, body aches, sore joints, rash, swollen glands (uncommon) Moderate problems following Tdap (Interfered with activities, but did not require medical attention)  Pain where the shot was given (about 1 in 5 adolescents or 1 in 100 adults)  Redness or swelling where the shot was given (up to about 1 in 16 adolescents or 1 in 25 adults)  Fever over 102F (about 1 in 100 adolescents or 1 in 250 adults)  Headache (about 3 in 20 adolescents or 1 in 10 adults)  Nausea, vomiting, diarrhea, stomach ache (up to 1 or 3 people in 100)  Swelling of the entire arm where the shot was given (up to about 3 in 100). Severe problems following Tdap (Unable to perform usual activities; required medical attention)  Swelling, severe pain, bleeding and redness in the arm where the shot was given (rare). A severe allergic reaction could occur after any vaccine (estimated less than 1 in a million doses). 5. What if there is a serious reaction? What should I look for?  Look for anything that concerns you, such as signs of a severe allergic reaction, very high fever, or behavior changes. Signs of a severe allergic reaction can include hives, swelling of the face and throat, difficulty breathing, a fast heartbeat, dizziness, and weakness. These would start a few minutes to a few hours after the vaccination. What should I do?  If you think it is a severe allergic reaction or other emergency that can't wait, call 9-1-1 or get the person to the nearest hospital. Otherwise, call your doctor.  Afterward, the reaction should be reported to the  "Vaccine Adverse Event Reporting System" (VAERS). Your doctor might file this report, or you can do it yourself through the VAERS web site at www.vaers.SamedayNews.es, or by calling 629-402-3009. VAERS is only for reporting reactions. They do not give medical advice.  6. The National Vaccine Injury Compensation Program The [employer] Vaccine Injury Compensation Program (VICP) is a federal program that was created to compensate people who may have been injured by certain vaccines. Persons who believe they may have been injured by a vaccine can learn about the program and about filing a claim by calling 567-342-6285 or visiting the [place] website at GoldCloset.com.ee.  7. How can I learn more?  Ask your doctor.  Call your local or state health department.  Contact the Centers for Disease Control and Prevention (CDC):  Call 506-255-9459 or visit CDC's website at http://hunter.com/. CDC Tdap Vaccine VIS (08/14/11) Document Released: 09/23/2011 Document Revised: 08/08/2013 Document Reviewed: 07/06/2013 [employer] Patient Information 2015 [place], [place]. This information is not intended to replace advice given to you by your health care Milisa Kimbell. Make sure you discuss any questions you have with your health care Magdalena Skilton.

## 2014-03-25 LAB — URINALYSIS, ROUTINE W REFLEX MICROSCOPIC
Bilirubin Urine: NEGATIVE
GLUCOSE, UA: NEGATIVE mg/dL
Hgb urine dipstick: NEGATIVE
Ketones, ur: NEGATIVE mg/dL
LEUKOCYTES UA: NEGATIVE
NITRITE: NEGATIVE
Protein, ur: NEGATIVE mg/dL
Specific Gravity, Urine: 1.014 (ref 1.005–1.030)
Urobilinogen, UA: 0.2 mg/dL (ref 0.0–1.0)
pH: 7.5 (ref 5.0–8.0)

## 2014-03-25 LAB — LIPID PANEL
CHOL/HDL RATIO: 2.9 ratio
Cholesterol: 149 mg/dL (ref 0–200)
HDL: 52 mg/dL (ref >39)
LDL CALC: 83 mg/dL (ref 0–99)
Triglycerides: 70 mg/dL (ref <150)
VLDL: 14 mg/dL (ref 0–40)

## 2014-03-25 LAB — HEPATIC FUNCTION PANEL
ALBUMIN: 3.9 g/dL (ref 3.5–5.2)
ALK PHOS: 48 U/L (ref 39–117)
ALT: 8 U/L (ref 0–35)
AST: 14 U/L (ref 0–37)
BILIRUBIN INDIRECT: 0.3 mg/dL (ref 0.2–1.2)
BILIRUBIN TOTAL: 0.4 mg/dL (ref 0.2–1.2)
Bilirubin, Direct: 0.1 mg/dL (ref 0.0–0.3)
Total Protein: 6.6 g/dL (ref 6.0–8.3)

## 2014-03-25 LAB — VITAMIN D 25 HYDROXY (VIT D DEFICIENCY, FRACTURES): Vit D, 25-Hydroxy: 17 ng/mL — ABNORMAL LOW (ref 30–100)

## 2014-03-25 LAB — BASIC METABOLIC PANEL WITH GFR
BUN: 11 mg/dL (ref 6–23)
CALCIUM: 8.8 mg/dL (ref 8.4–10.5)
CO2: 23 mEq/L (ref 19–32)
Chloride: 106 mEq/L (ref 96–112)
Creat: 0.64 mg/dL (ref 0.50–1.10)
GFR, Est African American: >89 mL/min
GLUCOSE: 84 mg/dL (ref 70–99)
POTASSIUM: 4.1 meq/L (ref 3.5–5.3)
Sodium: 140 mEq/L (ref 135–145)

## 2014-03-25 LAB — MICROALBUMIN / CREATININE URINE RATIO
CREATININE, URINE: 56.7 mg/dL
MICROALB/CREAT RATIO: 3.5 mg/g (ref 0.0–30.0)
Microalb, Ur: 0.2 mg/dL (ref <2.0)

## 2014-03-25 LAB — MAGNESIUM: Magnesium: 2 mg/dL (ref 1.5–2.5)

## 2014-03-25 LAB — INSULIN, FASTING: INSULIN FASTING, SERUM: 4.8 u[IU]/mL (ref 2.0–19.6)

## 2014-03-25 LAB — VITAMIN B12: VITAMIN B 12: 374 pg/mL (ref 211–911)

## 2014-03-26 LAB — URINE CULTURE
COLONY COUNT: NO GROWTH
Organism ID, Bacteria: NO GROWTH

## 2014-03-27 LAB — TB SKIN TEST
Induration: 0 mm
TB Skin Test: NEGATIVE

## 2014-05-04 ENCOUNTER — Ambulatory Visit: Payer: Self-pay

## 2014-05-05 ENCOUNTER — Ambulatory Visit: Payer: Self-pay

## 2014-05-08 ENCOUNTER — Ambulatory Visit: Payer: Commercial Managed Care - HMO

## 2014-05-08 DIAGNOSIS — E559 Vitamin D deficiency, unspecified: Secondary | ICD-10-CM

## 2014-05-08 DIAGNOSIS — E538 Deficiency of other specified B group vitamins: Secondary | ICD-10-CM

## 2014-05-08 DIAGNOSIS — D649 Anemia, unspecified: Secondary | ICD-10-CM

## 2014-05-08 LAB — CBC WITH DIFFERENTIAL/PLATELET
BASOS PCT: 0 % (ref 0–1)
Basophils Absolute: 0 10*3/uL (ref 0.0–0.1)
EOS PCT: 1 % (ref 0–5)
Eosinophils Absolute: 0.1 10*3/uL (ref 0.0–0.7)
HCT: 35.7 % — ABNORMAL LOW (ref 36.0–46.0)
Hemoglobin: 12.1 g/dL (ref 12.0–15.0)
LYMPHS PCT: 32 % (ref 12–46)
Lymphs Abs: 1.6 10*3/uL (ref 0.7–4.0)
MCH: 29.4 pg (ref 26.0–34.0)
MCHC: 33.9 g/dL (ref 30.0–36.0)
MCV: 86.9 fL (ref 78.0–100.0)
MONO ABS: 0.4 10*3/uL (ref 0.1–1.0)
MPV: 10.1 fL (ref 8.6–12.4)
Monocytes Relative: 8 % (ref 3–12)
NEUTROS PCT: 59 % (ref 43–77)
Neutro Abs: 3 10*3/uL (ref 1.7–7.7)
Platelets: 278 10*3/uL (ref 150–400)
RBC: 4.11 MIL/uL (ref 3.87–5.11)
RDW: 14.1 % (ref 11.5–15.5)
WBC: 5 10*3/uL (ref 4.0–10.5)

## 2014-05-08 LAB — VITAMIN D 25 HYDROXY (VIT D DEFICIENCY, FRACTURES): Vit D, 25-Hydroxy: 28 ng/mL — ABNORMAL LOW (ref 30–100)

## 2014-05-08 LAB — VITAMIN B12: Vitamin B-12: 425 pg/mL (ref 211–911)

## 2014-08-21 ENCOUNTER — Encounter: Payer: Self-pay | Admitting: Women's Health

## 2014-11-22 ENCOUNTER — Encounter: Payer: Self-pay | Admitting: Women's Health

## 2014-11-22 ENCOUNTER — Ambulatory Visit (INDEPENDENT_AMBULATORY_CARE_PROVIDER_SITE_OTHER): Payer: Commercial Managed Care - HMO | Admitting: Women's Health

## 2014-11-22 VITALS — BP 115/74 | Ht 64.0 in | Wt 145.4 lb

## 2014-11-22 DIAGNOSIS — Z01419 Encounter for gynecological examination (general) (routine) without abnormal findings: Secondary | ICD-10-CM

## 2014-11-22 NOTE — Patient Instructions (Signed)

## 2014-11-22 NOTE — Progress Notes (Signed)
Anna Hernandez Dec 26, 1970 [id number]    History:    Presents for annual exam.  Monthly cycle/BTL. 2001 ascus with negative C&B,  normal Paps after. Normal mammogram history. 2013 melanoma on her shoulder every 6 month skin checks. Reports normal labs at primary care.  Past medical history, past surgical history, family history and social history were all reviewed and documented in the EPIC chart. Works for the city of [place]. Jonah 16, Luke, 35, twins Francesca [place] and [place] 8 all doing well. Mother hypertension.  ROS:  A ROS was performed and pertinent positives and negatives are included.  Exam:  Filed Vitals:   11/22/14 1425  BP: 115/74    General appearance:  Normal Thyroid:  Symmetrical, normal in size, without palpable masses or nodularity. Respiratory  Auscultation:  Clear without wheezing or rhonchi Cardiovascular  Auscultation:  Regular rate, without rubs, murmurs or gallops  Edema/varicosities:  Not grossly evident Abdominal  Soft,nontender, without masses, guarding or rebound.  Liver/spleen:  No organomegaly noted  Hernia:  None appreciated  Skin  Inspection:  Grossly normal   Breasts: Examined lying and sitting.     Right: Without masses, retractions, discharge or axillary adenopathy.     Left: Without masses, retractions, discharge or axillary adenopathy. Gentitourinary   Inguinal/mons:  Normal without inguinal adenopathy  External genitalia:  Normal  BUS/Urethra/Skene's glands:  Normal  Vagina:  Normal  Cervix:  Normal  Uterus:   normal in size, shape and contour.  Midline and mobile  Adnexa/parametria:     Rt: Without masses or tenderness.   Lt: Without masses or tenderness.  Anus and perineum: Normal  Digital rectal exam: Normal sphincter tone without palpated masses or tenderness  Assessment/Plan:  44 y.o. M WF G3 P4 for annual exam with complaint of questionable lump under armpit cyclic, not present today  Monthly cycle/BTL 2013  melanoma  Lang: Continue to monitor cyclic changes under armpit return to office if persists. SBE's, continue annual 3-D screening mammogram history of dense breasts. Exercise, calcium rich diet, vitamin D 2000 daily encouraged vitamin D 28  05/2014 at primary care. Continue biannual skin checks report any changes immediately. UA, Pap normal 2015, new screening guidelines reviewed.   [place], 4:13 PM 11/22/2014

## 2015-03-29 ENCOUNTER — Ambulatory Visit (INDEPENDENT_AMBULATORY_CARE_PROVIDER_SITE_OTHER): Payer: Commercial Managed Care - HMO | Admitting: Physician Assistant

## 2015-03-29 ENCOUNTER — Other Ambulatory Visit: Payer: Self-pay

## 2015-03-29 ENCOUNTER — Encounter: Payer: Self-pay | Admitting: Physician Assistant

## 2015-03-29 VITALS — BP 110/70 | HR 79 | Temp 97.5°F | Resp 16 | Ht 64.0 in | Wt 149.0 lb

## 2015-03-29 DIAGNOSIS — Z1389 Encounter for screening for other disorder: Secondary | ICD-10-CM

## 2015-03-29 DIAGNOSIS — Z13220 Encounter for screening for lipoid disorders: Secondary | ICD-10-CM | POA: Diagnosis not present

## 2015-03-29 DIAGNOSIS — Z0000 Encounter for general adult medical examination without abnormal findings: Secondary | ICD-10-CM

## 2015-03-29 DIAGNOSIS — Z0001 Encounter for general adult medical examination with abnormal findings: Secondary | ICD-10-CM | POA: Diagnosis not present

## 2015-03-29 DIAGNOSIS — Z79899 Other long term (current) drug therapy: Secondary | ICD-10-CM | POA: Diagnosis not present

## 2015-03-29 DIAGNOSIS — R5383 Other fatigue: Secondary | ICD-10-CM

## 2015-03-29 DIAGNOSIS — Z139 Encounter for screening, unspecified: Secondary | ICD-10-CM

## 2015-03-29 DIAGNOSIS — Z1212 Encounter for screening for malignant neoplasm of rectum: Secondary | ICD-10-CM

## 2015-03-29 DIAGNOSIS — E538 Deficiency of other specified B group vitamins: Secondary | ICD-10-CM

## 2015-03-29 DIAGNOSIS — R6889 Other general symptoms and signs: Secondary | ICD-10-CM | POA: Diagnosis not present

## 2015-03-29 DIAGNOSIS — E559 Vitamin D deficiency, unspecified: Secondary | ICD-10-CM | POA: Diagnosis not present

## 2015-03-29 DIAGNOSIS — C4360 Malignant melanoma of unspecified upper limb, including shoulder: Secondary | ICD-10-CM | POA: Diagnosis not present

## 2015-03-29 DIAGNOSIS — N393 Stress incontinence (female) (male): Secondary | ICD-10-CM

## 2015-03-29 DIAGNOSIS — D649 Anemia, unspecified: Secondary | ICD-10-CM

## 2015-03-29 LAB — IRON AND TIBC
%SAT: 7 % — ABNORMAL LOW (ref 11–50)
Iron: 29 ug/dL — ABNORMAL LOW (ref 40–190)
TIBC: 434 ug/dL (ref 250–450)
UIBC: 405 ug/dL — AB (ref 125–400)

## 2015-03-29 LAB — CBC WITH DIFFERENTIAL/PLATELET
BASOS ABS: 0 10*3/uL (ref 0.0–0.1)
Basophils Relative: 0 % (ref 0–1)
EOS ABS: 0.1 10*3/uL (ref 0.0–0.7)
Eosinophils Relative: 1 % (ref 0–5)
HCT: 34.8 % — ABNORMAL LOW (ref 36.0–46.0)
Hemoglobin: 11.7 g/dL — ABNORMAL LOW (ref 12.0–15.0)
LYMPHS ABS: 2 10*3/uL (ref 0.7–4.0)
Lymphocytes Relative: 25 % (ref 12–46)
MCH: 28.3 pg (ref 26.0–34.0)
MCHC: 33.6 g/dL (ref 30.0–36.0)
MCV: 84.3 fL (ref 78.0–100.0)
MPV: 10 fL (ref 8.6–12.4)
Monocytes Absolute: 0.6 10*3/uL (ref 0.1–1.0)
Monocytes Relative: 7 % (ref 3–12)
NEUTROS PCT: 67 % (ref 43–77)
Neutro Abs: 5.3 10*3/uL (ref 1.7–7.7)
Platelets: 244 10*3/uL (ref 150–400)
RBC: 4.13 MIL/uL (ref 3.87–5.11)
RDW: 16.2 % — AB (ref 11.5–15.5)
WBC: 7.9 10*3/uL (ref 4.0–10.5)

## 2015-03-29 LAB — HEPATIC FUNCTION PANEL
ALBUMIN: 4.1 g/dL (ref 3.6–5.1)
ALT: 10 U/L (ref 6–29)
AST: 14 U/L (ref 10–35)
Alkaline Phosphatase: 57 U/L (ref 33–115)
Bilirubin, Direct: 0.1 mg/dL (ref <=0.2)
Indirect Bilirubin: 0.3 mg/dL (ref 0.2–1.2)
TOTAL PROTEIN: 6.9 g/dL (ref 6.1–8.1)
Total Bilirubin: 0.4 mg/dL (ref 0.2–1.2)

## 2015-03-29 LAB — LIPID PANEL
Cholesterol: 145 mg/dL (ref 125–200)
HDL: 58 mg/dL (ref >=46)
LDL CALC: 75 mg/dL (ref <130)
TRIGLYCERIDES: 59 mg/dL (ref <150)
Total CHOL/HDL Ratio: 2.5 Ratio (ref <=5.0)
VLDL: 12 mg/dL (ref <30)

## 2015-03-29 LAB — BASIC METABOLIC PANEL WITH GFR
BUN: 9 mg/dL (ref 7–25)
CO2: 22 mmol/L (ref 20–31)
CREATININE: 0.62 mg/dL (ref 0.50–1.10)
Calcium: 9 mg/dL (ref 8.6–10.2)
Chloride: 105 mmol/L (ref 98–110)
GFR, Est Non African American: >89 mL/min (ref >=60)
Glucose, Bld: 82 mg/dL (ref 65–99)
POTASSIUM: 4 mmol/L (ref 3.5–5.3)
Sodium: 137 mmol/L (ref 135–146)

## 2015-03-29 LAB — MAGNESIUM: Magnesium: 1.9 mg/dL (ref 1.5–2.5)

## 2015-03-29 NOTE — Progress Notes (Signed)
Complete Physical  Assessment and Plan: 1. B12 deficiency - Vitamin B12  2. Anemia, unspecified anemia type - Iron and TIBC - Ferritin  3. Vitamin D deficiency - VITAMIN D 25 Hydroxy (Vit-D Deficiency, Fractures)  4. Routine general medical examination at a health care facility  5. Stress incontinence, female - Ambulatory referral to pelvic floor PT at urology  6. Malignant melanoma of upper extremity, including shoulder, unspecified laterality ([place]) Continue follow up derm  7. Screening for malignant neoplasm of the rectum  8. Medication management - CBC with Differential/Platelet - BASIC METABOLIC PANEL WITH GFR - Hepatic function panel - Magnesium  9. Other fatigue - TSH - EKG 12-Lead  10. Screening cholesterol level - Lipid panel  11. Screening for blood or protein in urine - Urinalysis, Routine w reflex microscopic (not at [facility]) - Microalbumin / creatinine urine ratio  Discussed med's effects and SE's. Screening labs and tests as requested with regular follow-up as recommended. Over 40 minutes of exam, counseling, chart review, and complex, high level critical decision making was performed this visit.   HPI  44 y.o. female  presents for a complete physical.  Her blood pressure has been controlled at home, today their BP is BP: 110/70 mmHg She does not workout, use to but not as much.  She denies chest pain, shortness of breath, dizziness.  She has had some fatigue, B12 has helped, wants to start exercising again, states she sleeps well.  She follows with Derm, follows once a year, had melanoma removed left shoulder 2013.  She is not on cholesterol medication and denies myalgias. Her cholesterol is not at goal. The cholesterol last visit was:   Lab Results  Component Value Date   CHOL 149 03/24/2014   HDL 52 03/24/2014   LDLCALC 83 03/24/2014   TRIG 70 03/24/2014   CHOLHDL 2.9 03/24/2014   Last A1C in the office was:  Lab Results  Component Value Date    HGBA1C 5.3 03/24/2014  Patient is on Vitamin D supplement and fish oil combo.   Lab Results  Component Value Date   VD25OH 28* 05/08/2014    Lab Results  Component Value Date   [id number] 05/08/2014  She has some stress incontinence with running, sneezing, coughing, has had 4 kids. It is discouarging exercise and she would prefer to avoid medicine.    Current Medications:  Current Outpatient Prescriptions on File Prior to Visit  Medication Sig Dispense Refill  . Cyanocobalamin (VITAMIN B-12) 1000 MCG SUBL Place under the tongue daily.    Marland Kitchen Fexofenadine HCl (ALLEGRA PO) Take by mouth.      . fluticasone (FLONASE) 50 MCG/ACT nasal spray Place 2 sprays into both nostrils daily. 16 g 3   No current facility-administered medications on file prior to visit.   Health Maintenance:   Immunization History  Administered Date(s) Administered  . Influenza Split 02/06/2015  . Influenza-Unspecified 01/05/2014  . PPD Test 03/21/2013, 03/24/2014  . Td 04/08/2003  . Tdap 03/24/2014   Tetanus: 2015 Pneumovax: N/A Prevnar 13:  N/A Flu vaccine: 2016 Zostavax: N/A LMP: Patient's last menstrual period was 03/12/2015. Pap: 05/2013, Dr. Annamaria Boots MGM: 08/2014  DEXA: N/A Colonoscopy:N/A EGD: N/A  Last Dental Exam: Dr. [name] Last Eye Exam: [facility], contacts Patient Care Team: [name], MD as PCP - General (Internal Medicine) [name], FNP as Nurse Practitioner (Obstetrics and Gynecology) [name], MD as Consulting Physician (Dermatology) [name], MD as Referring Physician (Allergy and Immunology)  Allergies:  No Known Allergies Medical History:  Past Medical History  Diagnosis Date  . Allergy   . B12 deficiency anemia    Surgical History:  Past Surgical History  Procedure Laterality Date  . Cesarean section      LEFT OVARIAN ENDOMETRIOMA AND BTSP  . Tubal ligation      AT TIME OF C/S  . Melanoma excised     Family History:  Family History  Problem  Relation Age of Onset  . Hypertension Mother   . Stroke Mother     TIA  . Hypertension Father   . Diabetes Father   . Cancer Maternal Grandmother     OVARIAN/ leukemia  . Heart disease Maternal Grandfather   . Cancer Paternal Grandmother     Brain tumor  . Cancer Paternal Grandfather     Lung   Social History:  Social History  Substance Use Topics  . Smoking status: Former Smoker    Quit date: 03/21/1998  . Smokeless tobacco: Never Used  . Alcohol Use: Yes     Comment: sometimes   Review of Systems: Review of Systems  Constitutional: Positive for malaise/fatigue. Negative for fever, chills, weight loss and diaphoresis.  HENT: Negative.   Eyes: Negative.   Respiratory: Negative.   Cardiovascular: Negative.   Gastrointestinal: Negative.   Genitourinary: Negative.        + stress incontinence  Musculoskeletal: Negative.   Skin: Negative.   Neurological: Negative.  Negative for weakness.  Endo/Heme/Allergies: Negative.   Psychiatric/Behavioral: Negative.     Physical Exam: Estimated body mass index is 25.56 kg/(m^2) as calculated from the following:   Height as of this encounter: 5\' 4"  (1.626 m).   Weight as of this encounter: 149 lb (67.586 kg). BP 110/70 mmHg  Pulse 79  Temp(Src) 97.5 F (36.4 C) (Temporal)  Resp 16  Ht 5\' 4"  (1.626 m)  Wt 149 lb (67.586 kg)  BMI 25.56 kg/m2  SpO2 99%  LMP 03/12/2015 General Appearance: Well nourished, in no apparent distress.  Eyes: PERRLA, EOMs, conjunctiva no swelling or erythema, normal fundi and vessels.  Sinuses: No Frontal/maxillary tenderness  ENT/Mouth: Ext aud canals clear, normal light reflex with TMs without erythema, bulging. Good dentition. No erythema, swelling, or exudate on post pharynx. Tonsils not swollen or erythematous. Hearing normal.  Neck: Supple, thyroid normal. No bruits  Respiratory: Respiratory effort normal, BS equal bilaterally without rales, rhonchi, wheezing or stridor.  Cardio: RRR without  murmurs, rubs or gallops. Brisk peripheral pulses without edema.  Chest: symmetric, with normal excursions and percussion.  Breasts: defer Abdomen: Soft, nontender, no guarding, rebound, hernias, masses, or organomegaly.  Lymphatics: Non tender without lymphadenopathy.  Genitourinary: defer Musculoskeletal: Full ROM all peripheral extremities,5/5 strength, and normal gait.  Skin: Warm, dry without rashes, lesions, ecchymosis. Neuro: Cranial nerves intact, reflexes equal bilaterally. Normal muscle tone, no cerebellar symptoms. Sensation intact.  Psych: Awake and oriented X 3, normal affect, Insight and Judgment appropriate.   EKG: WNL no ST changes. AORTA SCAN: defer  Vicie Mutters 3:06 PM [facility]

## 2015-03-30 LAB — MICROALBUMIN / CREATININE URINE RATIO: CREATININE, URINE: 85 mg/dL (ref 20–320)

## 2015-03-30 LAB — URINALYSIS, ROUTINE W REFLEX MICROSCOPIC
BILIRUBIN URINE: NEGATIVE
Glucose, UA: NEGATIVE
Hgb urine dipstick: NEGATIVE
KETONES UR: NEGATIVE
Leukocytes, UA: NEGATIVE
NITRITE: NEGATIVE
PROTEIN: NEGATIVE
Specific Gravity, Urine: 1.013 (ref 1.001–1.035)
pH: 6 (ref 5.0–8.0)

## 2015-03-30 LAB — FERRITIN: FERRITIN: 9 ng/mL — AB (ref 10–291)

## 2015-03-30 LAB — TSH: TSH: 0.92 u[IU]/mL (ref 0.350–4.500)

## 2015-03-30 LAB — VITAMIN D 25 HYDROXY (VIT D DEFICIENCY, FRACTURES): VIT D 25 HYDROXY: 26 ng/mL — AB (ref 30–100)

## 2015-03-30 LAB — VITAMIN B12: Vitamin B-12: 744 pg/mL (ref 211–911)

## 2015-10-15 ENCOUNTER — Encounter: Payer: Self-pay | Admitting: Women's Health

## 2015-11-23 ENCOUNTER — Encounter: Payer: Self-pay | Admitting: *Deleted

## 2016-04-08 ENCOUNTER — Encounter: Payer: Self-pay | Admitting: Physician Assistant

## 2016-04-08 ENCOUNTER — Ambulatory Visit (INDEPENDENT_AMBULATORY_CARE_PROVIDER_SITE_OTHER): Payer: Commercial Managed Care - HMO | Admitting: Physician Assistant

## 2016-04-08 VITALS — BP 116/74 | HR 72 | Temp 97.9°F | Resp 14 | Ht 64.5 in | Wt 156.8 lb

## 2016-04-08 DIAGNOSIS — C4360 Malignant melanoma of unspecified upper limb, including shoulder: Secondary | ICD-10-CM

## 2016-04-08 DIAGNOSIS — J309 Allergic rhinitis, unspecified: Secondary | ICD-10-CM

## 2016-04-08 DIAGNOSIS — Z79899 Other long term (current) drug therapy: Secondary | ICD-10-CM

## 2016-04-08 DIAGNOSIS — R6889 Other general symptoms and signs: Secondary | ICD-10-CM | POA: Diagnosis not present

## 2016-04-08 DIAGNOSIS — E559 Vitamin D deficiency, unspecified: Secondary | ICD-10-CM | POA: Diagnosis not present

## 2016-04-08 DIAGNOSIS — Z13220 Encounter for screening for lipoid disorders: Secondary | ICD-10-CM

## 2016-04-08 DIAGNOSIS — Z1389 Encounter for screening for other disorder: Secondary | ICD-10-CM

## 2016-04-08 DIAGNOSIS — Z0000 Encounter for general adult medical examination without abnormal findings: Secondary | ICD-10-CM

## 2016-04-08 DIAGNOSIS — I10 Essential (primary) hypertension: Secondary | ICD-10-CM | POA: Diagnosis not present

## 2016-04-08 DIAGNOSIS — Z0001 Encounter for general adult medical examination with abnormal findings: Secondary | ICD-10-CM | POA: Diagnosis not present

## 2016-04-08 DIAGNOSIS — Z136 Encounter for screening for cardiovascular disorders: Secondary | ICD-10-CM

## 2016-04-08 DIAGNOSIS — N926 Irregular menstruation, unspecified: Secondary | ICD-10-CM

## 2016-04-08 DIAGNOSIS — E538 Deficiency of other specified B group vitamins: Secondary | ICD-10-CM | POA: Diagnosis not present

## 2016-04-08 DIAGNOSIS — R079 Chest pain, unspecified: Secondary | ICD-10-CM

## 2016-04-08 LAB — CBC WITH DIFFERENTIAL/PLATELET
BASOS ABS: 0 {cells}/uL (ref 0–200)
BASOS PCT: 0 %
EOS ABS: 114 {cells}/uL (ref 15–500)
Eosinophils Relative: 2 %
HCT: 40 % (ref 35.0–45.0)
HEMOGLOBIN: 13.1 g/dL (ref 11.7–15.5)
LYMPHS ABS: 1710 {cells}/uL (ref 850–3900)
Lymphocytes Relative: 30 %
MCH: 29.3 pg (ref 27.0–33.0)
MCHC: 32.8 g/dL (ref 32.0–36.0)
MCV: 89.5 fL (ref 80.0–100.0)
MONOS PCT: 8 %
MPV: 9.9 fL (ref 7.5–12.5)
Monocytes Absolute: 456 cells/uL (ref 200–950)
NEUTROS ABS: 3420 {cells}/uL (ref 1500–7800)
Neutrophils Relative %: 60 %
PLATELETS: 326 10*3/uL (ref 140–400)
RBC: 4.47 MIL/uL (ref 3.80–5.10)
RDW: 14.3 % (ref 11.0–15.0)
WBC: 5.7 10*3/uL (ref 3.8–10.8)

## 2016-04-08 LAB — URINALYSIS, ROUTINE W REFLEX MICROSCOPIC
Bilirubin Urine: NEGATIVE
GLUCOSE, UA: NEGATIVE
HGB URINE DIPSTICK: NEGATIVE
KETONES UR: NEGATIVE
Leukocytes, UA: NEGATIVE
Nitrite: NEGATIVE
PH: 5.5 (ref 5.0–8.0)
Protein, ur: NEGATIVE
Specific Gravity, Urine: 1.023 (ref 1.001–1.035)

## 2016-04-08 NOTE — Patient Instructions (Addendum)
Want 5000 IU daily  Vitamin D goal is between 60-80  Please make sure that you are taking your Vitamin D as directed.   It is very important as a natural anti-inflammatory   helping hair, skin, and nails, as well as reducing stroke and heart attack risk.    It helps your bones and helps with mood.  It also decreases numerous cancer risks so please take it as directed.   Low Vit D is associated with a 200-300% higher risk for CANCER   and 200-300% higher risk for HEART   ATTACK  &  STROKE.    .....................................Marland Kitchen  It is also associated with higher death rate at younger ages,   autoimmune diseases like Rheumatoid arthritis, Lupus, Multiple Sclerosis.     Also many other serious conditions, like depression, Alzheimer's  Dementia, infertility, muscle aches, fatigue, fibromyalgia - just to name a few.  +++++++++++++++++++  Can get liquid vitamin D from [place] here in [place] at  [facility] [address], [place], [place] [address]   Menopause and Herbal Products Introduction WHAT IS MENOPAUSE? Menopause is the normal time of life when menstrual periods decrease in frequency and eventually stop completely. This process can take several years for some women. Menopause is complete when you have had an absence of menstruation for a full year since your last menstrual period. It usually occurs between the ages of 88 and 38. It is not common for menopause to begin before the age of 33. During menopause, your body stops producing the female hormones estrogen and progesterone. Common symptoms associated with this loss of hormones (vasomotor symptoms) are:  Hot flashes.  Hot flushes.  Night sweats. Other common symptoms and complications of menopause include:  Decrease in sex drive.  Vaginal dryness and thinning of the walls of the vagina. This can make sex painful.  Dryness of the skin and development of  wrinkles.  Headaches.  Tiredness.  Irritability.  Memory problems.  Weight gain.  Bladder infections.  Hair growth on the face and chest.  Inability to reproduce offspring (infertility).  Loss of density in the bones (osteoporosis) increasing your risk for breaks (fractures).  Depression.  Hardening and narrowing of the arteries (atherosclerosis). This increases your risk of heart attack and stroke. WHAT TREATMENT OPTIONS ARE AVAILABLE? There are many treatment choices for menopause symptoms. The most common treatment is hormone replacement therapy. Many alternative therapies for menopause are emerging, including the use of herbal products. These supplements can be found in the form of herbs, teas, oils, tinctures, and pills. Common herbal supplements for menopause are made from plants that contain phytoestrogens. Phytoestrogens are compounds that occur naturally in plants and plant products. They act like estrogen in the body. Foods and herbs that contain phytoestrogens include:  Soy.  Flax seeds.  Red clover.  Ginseng. WHAT MENOPAUSE SYMPTOMS MAY BE HELPED IF I USE HERBAL PRODUCTS?  Vasomotor symptoms. These may be helped by:  Soy. Some studies show that soy may have a moderate benefit for hot flashes.  Black cohosh. There is limited evidence indicating this may be beneficial for hot flashes.  Symptoms that are related to heart and blood vessel disease. These may be helped by soy. Studies have shown that soy can help to lower cholesterol.  Depression. This may be helped by:  St. John's wort. There is limited evidence that shows this may help mild to moderate depression.  Black cohosh. There is evidence that this may help depression and mood  swings.  Osteoporosis. Soy may help to decrease bone loss that is associated with menopause and may prevent osteoporosis. Limited evidence indicates that red clover may offer some bone loss protection as well. Other herbal  products that are commonly used during menopause lack enough evidence to support their use as a replacement for conventional menopause therapies. These products include evening primrose, ginseng, and red clover. WHAT ARE THE CASES WHEN HERBAL PRODUCTS SHOULD NOT BE USED DURING MENOPAUSE? Do not use herbal products during menopause without your health care provider's approval if:  You are taking medicine.  You have a preexisting liver condition. ARE THERE ANY RISKS IN MY TAKING HERBAL PRODUCTS DURING MENOPAUSE? If you choose to use herbal products to help with symptoms of menopause, keep in mind that:  Different supplements have different and unmeasured amounts of herbal ingredients.  Herbal products are not regulated the same way that medicines are.  Concentrations of herbs may vary depending on the way they are prepared. For example, the concentration may be different in a pill, tea, oil, and tincture.  Little is known about the risks of using herbal products, particularly the risks of long-term use.  Some herbal supplements can be harmful when combined with certain medicines. Most commonly reported side effects of herbal products are mild. However, if used improperly, many herbal supplements can cause serious problems. Talk to your health care provider before starting any herbal product. If problems develop, stop taking the supplement and let your health care provider know. This information is not intended to replace advice given to you by your health care provider. Make sure you discuss any questions you have with your health care provider. Document Released: 09/10/2007 Document Revised: 08/30/2015 Document Reviewed: 09/06/2013  2017 Elsevier  Chest Wall Pain Chest wall pain is pain in or around the bones and muscles of your chest. Sometimes, an injury causes this pain. Sometimes, the cause may not be known. This pain may take several weeks or longer to get better. Follow these  instructions at home: Pay attention to any changes in your symptoms. Take these actions to help with your pain:  Rest as told by your health care provider.  Avoid activities that cause pain. These include any activities that use your chest muscles or your abdominal and side muscles to lift heavy items.  If directed, apply ice to the painful area:  Put ice in a plastic bag.  Place a towel between your skin and the bag.  Leave the ice on for 20 minutes, 2-3 times per day.  Take over-the-counter and prescription medicines only as told by your health care provider.  Do not use tobacco products, including cigarettes, chewing tobacco, and e-cigarettes. If you need help quitting, ask your health care provider.  Keep all follow-up visits as told by your health care provider. This is important. Contact a health care provider if:  You have a fever.  Your chest pain becomes worse.  You have new symptoms. Get help right away if:  You have nausea or vomiting.  You feel sweaty or light-headed.  You have a cough with phlegm (sputum) or you cough up blood.  You develop shortness of breath. This information is not intended to replace advice given to you by your health care provider. Make sure you discuss any questions you have with your health care provider. Document Released: 03/24/2005 Document Revised: 08/02/2015 Document Reviewed: 06/19/2014 Elsevier Interactive Patient Education  2017 [employer].

## 2016-04-08 NOTE — Progress Notes (Signed)
Complete Physical  Assessment and Plan: B12 deficiency - Vitamin B12  Anemia, unspecified anemia type - Iron and TIBC - Ferritin   Vitamin D deficiency - VITAMIN D 25 Hydroxy (Vit-D Deficiency, Fractures)   Routine general medical examination at a health care facility  Malignant melanoma of upper extremity, including shoulder, unspecified laterality ([place]) Continue follow up derm   Medication management - CBC with Differential/Platelet - BASIC METABOLIC PANEL WITH GFR - Hepatic function panel - Magnesium   Screening cholesterol level - Lipid panel   Screening for blood or protein in urine - Urinalysis, Routine w reflex microscopic (not at [facility]) - Microalbumin / creatinine urine ratio  Chest pain, unspecified type Very atypical, nonexertional, no accompaniments, likely chest wall/muscular from yoga versus GERD, get on tums, ice/heat, and rest x 2 weeks, if worse or not better make OV or go to the ER.  -     EKG 12-Lead  Irregular menses Likely perimenopause, will check TSH/other causes and she will follow up with her GYN for PAP, due this year -     TSH -     Iron and TIBC -     Ferritin  Discussed med's effects and SE's. Screening labs and tests as requested with regular follow-up as recommended. Over 40 minutes of exam, counseling, chart review, and complex, high level critical decision making was performed this visit.   HPI  46 y.o. female  presents for a complete physical.  Her blood pressure has been controlled at home, today their BP is BP: 116/74 She does workout, 30 mins workout, walking, yoga.  She denies chest pain, shortness of breath, dizziness.  She has had some fatigue, B12 has helped, wants to start exercising again, states she sleeps well.  She follows with Derm, follows once a year, had melanoma removed left shoulder 2013.  She is not on cholesterol medication and denies myalgias. Her cholesterol is not at goal. The cholesterol last visit was:   Lab  Results  Component Value Date   CHOL 145 03/29/2015   HDL 58 03/29/2015   LDLCALC 75 03/29/2015   TRIG 59 03/29/2015   CHOLHDL 2.5 03/29/2015    Last A1C in the office was:  Lab Results  Component Value Date   HGBA1C 5.3 03/24/2014   Patient is on Vitamin D supplement and fish oil combo.   Lab Results  Component Value Date   VD25OH 26 (L) 03/29/2015    Lab Results  Component Value Date   [id number] 03/29/2015  She has some stress incontinence with running, sneezing, coughing, has had 4 kids. She did pelvic floor PT and states it has helped.   She has been having slightly irregular periods, will not having menses or have very light menses.  Left sided chest pain, non exertional, intermittent x 1 month, last x 1 min, sharp pain, no radiation, no accompaniments.   Current Medications:  Current Outpatient Prescriptions on File Prior to Visit  Medication Sig Dispense Refill  . Cyanocobalamin (VITAMIN B-12) 1000 MCG SUBL Place under the tongue daily.    Marland Kitchen Fexofenadine HCl (ALLEGRA PO) Take by mouth.      . fluticasone (FLONASE) 50 MCG/ACT nasal spray Place 2 sprays into both nostrils daily. 16 g 3   No current facility-administered medications on file prior to visit.    Health Maintenance:   Immunization History  Administered Date(s) Administered  . Influenza Split 02/06/2015  . Influenza-Unspecified 01/05/2014  . PPD Test 03/21/2013, 03/24/2014  . Td  04/08/2003  . Tdap 03/24/2014   Tetanus: 2015 Pneumovax: N/A Prevnar 13:  N/A Flu vaccine: 2017  Zostavax: N/A  LMP: Patient's last menstrual period was 03/17/2016. Pap: 05/2013, Dr. Annamaria Boots MGM: 09/2015 DEXA: N/A Colonoscopy:N/A EGD: N/A  Last Dental Exam: Dr. [name] Last Eye Exam: [facility], contacts Patient Care Team: [name], MD as PCP - General (Internal Medicine) [name], FNP as Nurse Practitioner (Obstetrics and Gynecology) [name], MD as Consulting Physician (Dermatology) [name], MD as Referring Physician (Allergy and Immunology)  Medical History:  Past Medical History:  Diagnosis Date  . Allergy   . B12 deficiency anemia    Allergies No Known Allergies  SURGICAL HISTORY She  has a past surgical history that includes Cesarean section; Tubal ligation; and Melanoma excised.   FAMILY HISTORY Her family history includes Cancer in her maternal grandmother, paternal grandfather, and paternal grandmother; Diabetes in her father; Heart disease in her maternal grandfather; Hypertension in her father and mother; Stroke in her mother.   SOCIAL HISTORY She  reports that she quit smoking about 18 years ago. She has never used smokeless tobacco. She reports that she drinks alcohol. She reports that she does not use drugs.   Review of Systems: Review of Systems  Constitutional: Negative for chills, diaphoresis, fever, malaise/fatigue and weight loss.  HENT: Negative.   Eyes: Negative.   Respiratory: Negative.  Negative for cough and shortness of breath.   Cardiovascular: Positive for chest pain. Negative for palpitations, claudication, leg swelling and PND.  Gastrointestinal: Negative.   Genitourinary: Negative.   Musculoskeletal: Negative.   Skin: Negative.   Neurological: Negative.  Negative for weakness.  Endo/Heme/Allergies: Negative.   Psychiatric/Behavioral: Negative.     Physical Exam: Estimated body mass index is 26.5 kg/m as calculated from the following:   Height as of this encounter: 5' 4.5" (1.638 m).   Weight as of this encounter: 156 lb 12.8 oz (71.1 kg). BP 116/74   Pulse 72   Temp 97.9 F (36.6 C)   Resp 14   Ht 5' 4.5" (1.638 m)   Wt 156 lb 12.8 oz (71.1 kg)   LMP 03/17/2016   SpO2 98%   BMI 26.50 kg/m  General Appearance: Well nourished, in no apparent distress.  Eyes: PERRLA, EOMs, conjunctiva no swelling or erythema, normal fundi and vessels.  Sinuses: No Frontal/maxillary tenderness  ENT/Mouth: Ext aud canals clear, normal  light reflex with TMs without erythema, bulging. Good dentition. No erythema, swelling, or exudate on post pharynx. Tonsils not swollen or erythematous. Hearing normal.  Neck: Supple, thyroid normal. No bruits  Respiratory: Respiratory effort normal, BS equal bilaterally without rales, rhonchi, wheezing or stridor.  Cardio: RRR without murmurs, rubs or gallops. Brisk peripheral pulses without edema.  Chest: symmetric, with normal excursions and percussion.  Breasts: defer Abdomen: Soft, nontender, no guarding, rebound, hernias, masses, or organomegaly.  Lymphatics: Non tender without lymphadenopathy.  Genitourinary: defer Musculoskeletal: Full ROM all peripheral extremities,5/5 strength, and normal gait.  Skin: Warm, dry without rashes, lesions, ecchymosis. Neuro: Cranial nerves intact, reflexes equal bilaterally. Normal muscle tone, no cerebellar symptoms. Sensation intact.  Psych: Awake and oriented X 3, normal affect, Insight and Judgment appropriate.   EKG: WNL no ST changes. AORTA SCAN: defer  Vicie Mutters 3:18 PM [facility]

## 2016-04-09 LAB — IRON AND TIBC
%SAT: 23 % (ref 11–50)
Iron: 106 ug/dL (ref 40–190)
TIBC: 470 ug/dL — AB (ref 250–450)
UIBC: 364 ug/dL (ref 125–400)

## 2016-04-09 LAB — TSH: TSH: 1.15 mIU/L

## 2016-04-09 LAB — LIPID PANEL
CHOL/HDL RATIO: 3.7 ratio (ref <5.0)
Cholesterol: 170 mg/dL (ref <200)
HDL: 46 mg/dL — AB (ref >50)
LDL CALC: 98 mg/dL (ref <100)
Triglycerides: 128 mg/dL (ref <150)
VLDL: 26 mg/dL (ref <30)

## 2016-04-09 LAB — VITAMIN B12: VITAMIN B 12: 337 pg/mL (ref 200–1100)

## 2016-04-09 LAB — MICROALBUMIN / CREATININE URINE RATIO
Creatinine, Urine: 160 mg/dL (ref 20–320)
Microalb Creat Ratio: 3 mcg/mg creat (ref <30)
Microalb, Ur: 0.5 mg/dL

## 2016-04-09 LAB — MAGNESIUM: MAGNESIUM: 2 mg/dL (ref 1.5–2.5)

## 2016-04-09 LAB — BASIC METABOLIC PANEL WITH GFR
BUN: 12 mg/dL (ref 7–25)
CHLORIDE: 105 mmol/L (ref 98–110)
CO2: 20 mmol/L (ref 20–31)
CREATININE: 0.6 mg/dL (ref 0.50–1.10)
Calcium: 8.9 mg/dL (ref 8.6–10.2)
GFR, Est African American: >89 mL/min (ref >=60)
GFR, Est Non African American: >89 mL/min (ref >=60)
Glucose, Bld: 86 mg/dL (ref 65–99)
POTASSIUM: 3.9 mmol/L (ref 3.5–5.3)
SODIUM: 137 mmol/L (ref 135–146)

## 2016-04-09 LAB — HEPATIC FUNCTION PANEL
ALK PHOS: 66 U/L (ref 33–115)
ALT: 11 U/L (ref 6–29)
AST: 16 U/L (ref 10–35)
Albumin: 4.4 g/dL (ref 3.6–5.1)
BILIRUBIN DIRECT: 0 mg/dL (ref <=0.2)
BILIRUBIN INDIRECT: 0.2 mg/dL (ref 0.2–1.2)
BILIRUBIN TOTAL: 0.2 mg/dL (ref 0.2–1.2)
Total Protein: 7.2 g/dL (ref 6.1–8.1)

## 2016-04-09 LAB — VITAMIN D 25 HYDROXY (VIT D DEFICIENCY, FRACTURES): Vit D, 25-Hydroxy: 26 ng/mL — ABNORMAL LOW (ref 30–100)

## 2016-04-09 LAB — FERRITIN: Ferritin: 9 ng/mL — ABNORMAL LOW (ref 10–232)

## 2016-08-15 DIAGNOSIS — L239 Allergic contact dermatitis, unspecified cause: Secondary | ICD-10-CM | POA: Diagnosis not present

## 2016-08-20 ENCOUNTER — Encounter: Payer: Self-pay | Admitting: Gynecology

## 2016-10-08 DIAGNOSIS — J018 Other acute sinusitis: Secondary | ICD-10-CM | POA: Diagnosis not present

## 2016-12-11 ENCOUNTER — Encounter: Payer: Self-pay | Admitting: Women's Health

## 2016-12-11 DIAGNOSIS — Z1231 Encounter for screening mammogram for malignant neoplasm of breast: Secondary | ICD-10-CM | POA: Diagnosis not present

## 2016-12-18 DIAGNOSIS — H16292 Other keratoconjunctivitis, left eye: Secondary | ICD-10-CM | POA: Diagnosis not present

## 2016-12-25 DIAGNOSIS — H16292 Other keratoconjunctivitis, left eye: Secondary | ICD-10-CM | POA: Diagnosis not present

## 2017-01-02 ENCOUNTER — Encounter: Payer: Self-pay | Admitting: Adult Health

## 2017-01-02 ENCOUNTER — Ambulatory Visit (INDEPENDENT_AMBULATORY_CARE_PROVIDER_SITE_OTHER): Payer: 59 | Admitting: Adult Health

## 2017-01-02 VITALS — BP 124/80 | Ht 64.5 in | Wt 153.6 lb

## 2017-01-02 DIAGNOSIS — J0100 Acute maxillary sinusitis, unspecified: Secondary | ICD-10-CM

## 2017-01-02 MED ORDER — AZITHROMYCIN 250 MG PO TABS: 250.0000 mg | ORAL_TABLET | Freq: Every day | ORAL | 0 refills | Status: DC

## 2017-01-02 MED ORDER — PREDNISONE 20 MG PO TABS: ORAL_TABLET | ORAL | 0 refills | Status: DC

## 2017-01-02 NOTE — Progress Notes (Signed)
Assessment and Plan:  Anna Hernandez was seen today for acute visit, sinus problem.  Diagnoses and all orders for this visit:  Acute maxillary sinusitis, recurrence not specified -     predniSONE (DELTASONE) 20 MG tablet; 2 tablets daily for 3 days, 1 tablet daily for 4 days. -     azithromycin (ZITHROMAX) 250 MG tablet; Take 1 tablet (250 mg total) by mouth daily. Take 2 tablets PO on day 1, then 1 tablet PO Q24H x 4 days  Instructed patient to initiate prednisone; if symptoms not significantly improved in 3-4 days, to take the antibiotic which was printed for patient. Encouraged hydration. Encouraged to continue allergy medications daily until at minimum there is resolution of symptoms. May utilize OTC analgesics as needed for paint. Present to ER with acute worsening of symptoms, HA, weakness, changes in vision/hearing, other new/severe symptoms.   Discussed med's effects and SE's.   Over 15 minutes of exam, counseling, chart review, and critical decision making was performed.   Future Appointments Date Time Provider [place]  02/09/2017 2:00 PM Huel Cote, NP [facility]  04/14/2017 3:00 PM Vicie Mutters, PA-C [facility] None    ------------------------------------------------------------------------------------------------------------------   HPI 46 y.o.female presents for ongoing URI symptoms since July that has suddenly worsened in the past two weeks. Began as a viral type URI that the whole family had (congestion, cough, sinus pressure), but sinus pressure symptoms, L ear pressure when lying on that side, increased "irritation" and tearing of the left eye has continued. She was seen by an ophthalmologist for her eye symptoms twice recently for treatment (steroid drops) and follow up.   She has been taking allegra and flonase regularly for the past week, but reports this has not significantly improved her symptoms.    She denies recurrent sinus infections, facial injuries  recently or in distant history, other recurrent concerning issues with her ENT.  Denies headaches, changes in vision/hearing, fever, chills, cough, CP, SOB, N/V/D. She endorses fatigue related to poor quality of recent sleep.   Past Medical History:  Diagnosis Date  . Allergy   . B12 deficiency anemia      No Known Allergies  Current Outpatient Prescriptions on File Prior to Visit  Medication Sig  . Cyanocobalamin (VITAMIN B-12) 1000 MCG SUBL Place under the tongue daily.  Marland Kitchen Fexofenadine HCl (ALLEGRA PO) Take by mouth.    . fluticasone (FLONASE) 50 MCG/ACT nasal spray Place 2 sprays into both nostrils daily.   No current facility-administered medications on file prior to visit.     ROS: all negative except above.   Physical Exam:  BP 124/80   Ht 5' 4.5" (1.638 m)   Wt 153 lb 9.6 oz (69.7 kg)   LMP 12/29/2016   BMI 25.96 kg/m   General Appearance: Well nourished, well-appearing female in no apparent distress. Eyes: PERRLA, EOMs, conjunctiva no swelling, discharge or erythema; left eye with increased lacrimation compared to right.  Sinuses: +left sided maxillary tenderness; no external facial swelling/erythema.  ENT/Mouth: Ext aud canals clear, TMs without erythema, bulging; bilateral fluid effusions present. No erythema, swelling, or exudate on post pharynx.  Tonsils not swollen or erythematous. Hearing normal.  Neck: Supple, thyroid normal.  Respiratory: Respiratory effort normal, BS equal bilaterally without rales, rhonchi, wheezing or stridor.  Cardio: RRR with no MRGs. Brisk peripheral pulses without edema.  Abdomen: Soft, + BS.  Non tender. Lymphatics: Non tender without lymphadenopathy.  Musculoskeletal: Full ROM, normal gait.  Skin: Warm, dry without rashes, lesions,  ecchymosis.  Neuro: Cranial nerves intact. Normal muscle tone, no cerebellar symptoms. Sensation intact.  Psych: Awake and oriented X 3, normal affect, Insight and Judgment appropriate.     Izora Ribas, NP 12:06 PM [facility]

## 2017-01-02 NOTE — Patient Instructions (Signed)

## 2017-01-31 DIAGNOSIS — H1089 Other conjunctivitis: Secondary | ICD-10-CM | POA: Diagnosis not present

## 2017-02-09 ENCOUNTER — Ambulatory Visit (INDEPENDENT_AMBULATORY_CARE_PROVIDER_SITE_OTHER): Payer: 59 | Admitting: Women's Health

## 2017-02-09 ENCOUNTER — Encounter: Payer: Self-pay | Admitting: Women's Health

## 2017-02-09 VITALS — BP 118/80 | Ht 64.0 in | Wt 143.0 lb

## 2017-02-09 DIAGNOSIS — Z01419 Encounter for gynecological examination (general) (routine) without abnormal findings: Secondary | ICD-10-CM | POA: Diagnosis not present

## 2017-02-09 DIAGNOSIS — R350 Frequency of micturition: Secondary | ICD-10-CM | POA: Diagnosis not present

## 2017-02-09 LAB — HM PAP SMEAR: HM PAP: NEGATIVE

## 2017-02-09 NOTE — Patient Instructions (Signed)

## 2017-02-09 NOTE — Progress Notes (Signed)
Anna Hernandez 1971/01/01 [id number]    History:    Presents for annual exam.  Monthly cycle/BTL. 2001 ascus with negative C&B. Normal Paps after. Normal mammogram history. History of melanoma on shoulder and face has annual dermatology screens.  Past medical history, past surgical history, family history and social history were all reviewed and documented in the EPIC chart. Works for the city of [place]. Sons are 17, 75, twin girls are 10  all doing well.  ROS:  A ROS was performed and pertinent positives and negatives are included.  Exam:  Vitals:   02/09/17 1419  BP: 118/80  Weight: 143 lb (64.9 kg)  Height: 5\' 4"  (1.626 m)   Body mass index is 24.55 kg/m.   General appearance:  Normal Thyroid:  Symmetrical, normal in size, without palpable masses or nodularity. Respiratory  Auscultation:  Clear without wheezing or rhonchi Cardiovascular  Auscultation:  Regular rate, without rubs, murmurs or gallops  Edema/varicosities:  Not grossly evident Abdominal  Soft,nontender, without masses, guarding or rebound.  Liver/spleen:  No organomegaly noted  Hernia:  None appreciated  Skin  Inspection:  Grossly normal   Breasts: Examined lying and sitting.     Right: Without masses, retractions, discharge or axillary adenopathy.     Left: Without masses, retractions, discharge or axillary adenopathy. Gentitourinary   Inguinal/mons:  Normal without inguinal adenopathy  External genitalia:  Normal  BUS/Urethra/Skene's glands:  Normal  Vagina:  Normal  Cervix:  Normal  Uterus:  normal in size, shape and contour.  Midline and mobile  Adnexa/parametria:     Rt: Without masses or tenderness.   Lt: Without masses or tenderness.  Anus and perineum: Normal  Digital rectal exam: Normal sphincter tone without palpated masses or tenderness  Assessment/Plan:  46 y.o. M WF G3 P4  for annual exam with no complaints.  Monthly cycle/BTL History of melanoma-annual checks dermatologist  manages Labs-primary care  Plan: SBE's, continue annual screening mammogram, calcium rich diet, vitamin D 1000 daily encouraged. Reviewed importance of exercise,  Pap with HR HPV typing, new screening guidelines reviewed.    [place], 6:28 PM 02/09/2017

## 2017-02-10 ENCOUNTER — Ambulatory Visit: Payer: 59 | Admitting: Physician Assistant

## 2017-02-10 ENCOUNTER — Encounter: Payer: Self-pay | Admitting: Physician Assistant

## 2017-02-10 VITALS — BP 112/68 | HR 88 | Temp 97.7°F | Resp 16 | Ht 64.0 in | Wt 149.0 lb

## 2017-02-10 DIAGNOSIS — J320 Chronic maxillary sinusitis: Secondary | ICD-10-CM | POA: Diagnosis not present

## 2017-02-10 LAB — URINALYSIS W MICROSCOPIC + REFLEX CULTURE
BACTERIA UA: NONE SEEN /HPF
Bilirubin Urine: NEGATIVE
Glucose, UA: NEGATIVE
Hgb urine dipstick: NEGATIVE
Hyaline Cast: NONE SEEN /LPF
KETONES UR: NEGATIVE
LEUKOCYTE ESTERASE: NEGATIVE
Nitrites, Initial: NEGATIVE
Protein, ur: NEGATIVE
RBC / HPF: NONE SEEN /HPF (ref 0–2)
SPECIFIC GRAVITY, URINE: 1.029 (ref 1.001–1.03)
SQUAMOUS EPITHELIAL / LPF: NONE SEEN /HPF (ref <=5)
WBC, UA: NONE SEEN /HPF (ref 0–5)
pH: <=5 (ref 5.0–8.0)

## 2017-02-10 LAB — NO CULTURE INDICATED

## 2017-02-10 MED ORDER — NEOMYCIN-POLYMYXIN-DEXAMETH 3.5-10000-0.1 OP SUSP: 1.0000 [drp] | Freq: Four times a day (QID) | OPHTHALMIC | 1 refills | Status: DC

## 2017-02-10 MED ORDER — PREDNISONE 20 MG PO TABS: ORAL_TABLET | ORAL | 0 refills | Status: DC

## 2017-02-10 MED ORDER — AMOXICILLIN-POT CLAVULANATE 875-125 MG PO TABS
1.0000 | ORAL_TABLET | Freq: Two times a day (BID) | ORAL | 0 refills | Status: DC
Start: 2017-02-10 — End: 2017-04-14

## 2017-02-10 NOTE — Progress Notes (Signed)
Subjective:    Patient ID: Anna Hernandez, female    DOB: 1970-07-13, 46 y.o.   MRN: [id number]  HPI 46 y.o. WF presents with eye irritation.  Saw Eye doctor in Sept, given steroid drop, then saw Caryl Pina in 09/28, given zpak/prednisone, some improvement but then past 1-2 weeks ear pain, sinus pressure, and this AM left eye crusted shut. Can not wear her contacts at this time.  No fever, chills, no blurry vision, no pain. She is on flonase/allerga.   Blood pressure 112/68, pulse 88, temperature 97.7 F (36.5 C), resp. rate 16, height 5\' 4"  (1.626 m), weight 149 lb (67.6 kg), last menstrual period 01/08/2017, SpO2 98 %.  Medications Current Outpatient Medications on File Prior to Visit  Medication Sig  . Cholecalciferol (VITAMIN D PO) Take 1 tablet daily by mouth.  . Cyanocobalamin (VITAMIN B-12) 1000 MCG SUBL Place under the tongue daily.  Marland Kitchen Fexofenadine HCl (ALLEGRA PO) Take by mouth.    . fluticasone (FLONASE) 50 MCG/ACT nasal spray Place 2 sprays into both nostrils daily.   No current facility-administered medications on file prior to visit.     Problem list She has Melanoma ([place]) and Allergic rhinitis on their problem list.  Review of Systems  Constitutional: Negative for chills, diaphoresis, fatigue and fever.  HENT: Positive for congestion, ear pain, postnasal drip, rhinorrhea and sore throat. Negative for ear discharge, mouth sores, sinus pressure, trouble swallowing and voice change.   Eyes: Positive for discharge and itching. Negative for photophobia, pain, redness and visual disturbance.  Respiratory: Negative.  Negative for cough, wheezing and stridor.   Cardiovascular: Negative.   Gastrointestinal: Negative.  Negative for abdominal pain, constipation, diarrhea, nausea and vomiting.  Genitourinary: Negative.   Musculoskeletal: Negative for neck pain.       Body aches  Skin: Negative.  Negative for rash.  Allergic/Immunologic: Positive for environmental  allergies.  Neurological: Negative.  Negative for dizziness, light-headedness and headaches.        Objective:   Physical Exam  Constitutional: She appears well-developed and well-nourished.  HENT:  Head: Normocephalic and atraumatic.  Right Ear: External ear and ear canal normal. A middle ear effusion is present.  Left Ear: External ear and ear canal normal. A middle ear effusion is present.  Nose: Right sinus exhibits maxillary sinus tenderness. Right sinus exhibits no frontal sinus tenderness. Left sinus exhibits maxillary sinus tenderness. Left sinus exhibits no frontal sinus tenderness.  Eyes: Conjunctivae and EOM are normal. Pupils are equal, round, and reactive to light. Left eye exhibits discharge (thin discharge/watery). Right conjunctiva is not injected. Right conjunctiva has no hemorrhage. Left conjunctiva is not injected. Left conjunctiva has no hemorrhage.  Neck: Normal range of motion. Neck supple.  Cardiovascular: Normal rate, regular rhythm, normal heart sounds and intact distal pulses.  Pulmonary/Chest: Effort normal and breath sounds normal. No respiratory distress. She has no wheezes.  Abdominal: Soft. Bowel sounds are normal.  Lymphadenopathy:    She has no cervical adenopathy.  Skin: Skin is warm and dry.       Assessment & Plan:   Chronic maxillary sinusitis -     amoxicillin-clavulanate (AUGMENTIN) 875-125 MG tablet; Take 1 tablet 2 (two) times daily by mouth. 7 days -     predniSONE (DELTASONE) 20 MG tablet; 2 tablets daily for 3 days, 1 tablet daily for 4 days. -     neomycin-polymyxin b-dexamethasone (MAXITROL) 3.5-10000-0.1 SUSP; Place 1 drop 4 (four) times daily into both eyes.   Follow up  with eye doctor if any eye pain, changes in vision If not better ? Need to see ENT

## 2017-02-10 NOTE — Patient Instructions (Addendum)
Don't wear contacts x at least 1 month If not better let us know, please message  HOW TO TREAT VIRAL COUGH AND COLD SYMPTOMS:  -Symptoms usually last at least 1 week with the worst symptoms being around day 4.  - colds usually start with a sore throat and end with a cough, and the cough can take 2 weeks to get better.  -No antibiotics are needed for colds, flu, sore throats, cough, bronchitis UNLESS symptoms are longer than 7 days OR if you are getting better then get drastically worse.  -There are a lot of combination medications (Dayquil, Nyquil, Vicks 44, tyelnol cold and sinus, ETC). Please look at the ingredients on the back so that you are treating the correct symptoms and not doubling up on medications/ingredients.    Medicines you can use  Nasal congestion  - pseudoephedrine (Sudafed)- behind the counter, do not use if you have high blood pressure, medicine that have -D in them.  - phenylephrine (Sudafed PE) -Dextormethorphan + chlorpheniramine (Coridcidin HBP)- okay if you have high blood pressure -Oxymetazoline (Afrin) nasal spray- LIMIT to 3 days -Saline nasal spray -Neti pot (used distilled or bottled water)  Ear pain/congestion  -pseudoephedrine (sudafed) - Nasonex/flonase nasal spray  Fever  -Acetaminophen (Tyelnol) -Ibuprofen (Advil, motrin, aleve)  Sore Throat  -Acetaminophen (Tyelnol) -Ibuprofen (Advil, motrin, aleve) -Drink a lot of water -Gargle with salt water - Rest your voice (don't talk) -Throat sprays -Cough drops  Body Aches  -Acetaminophen (Tyelnol) -Ibuprofen (Advil, motrin, aleve)  Headache  -Acetaminophen (Tyelnol) -Ibuprofen (Advil, motrin, aleve) - Exedrin, Exedrin Migraine  Allergy symptoms (cough, sneeze, runny nose, itchy eyes) -Claritin or loratadine cheapest but likely the weakest  -Zyrtec or certizine at night because it can make you sleepy -The strongest is allegra or fexafinadine  Cheapest at [employer], [employer],  [employer]  Cough  -Dextromethorphan (Delsym)- medicine that has DM in it -Guafenesin (Mucinex/Robitussin) - cough drops - drink lots of water  Chest Congestion  -Guafenesin (Mucinex/Robitussin)  Red Itchy Eyes  - Naphcon-A  Upset Stomach  - Bland diet (nothing spicy, greasy, fried, and high acid foods like tomatoes, oranges, berries) -OKAY- cereal, bread, soup, crackers, rice -Eat smaller more frequent meals -reduce caffeine, no alcohol -Loperamide (Imodium-AD) if diarrhea -Prevacid for heart burn  General health when sick  -Hydration -wash your hands frequently -keep surfaces clean -change pillow cases and sheets often -Get fresh air but do not exercise strenuously -Vitamin D, double up on it - Vitamin C -Zinc

## 2017-02-11 LAB — PAP, TP IMAGING W/ HPV RNA, RFLX HPV TYPE 16,18/45: HPV DNA High Risk: NOT DETECTED

## 2017-04-13 NOTE — Progress Notes (Signed)
Complete Physical  Assessment and Plan: B12 deficiency - Vitamin B12  Anemia, unspecified anemia type - Iron and TIBC - Ferritin   Vitamin D deficiency - VITAMIN D 25 Hydroxy (Vit-D Deficiency, Fractures)   Routine general medical examination at a health care facility  Malignant melanoma of upper extremity, including shoulder, unspecified laterality ([place]) Continue follow up derm   Medication management - CBC with Differential/Platelet - BASIC METABOLIC PANEL WITH GFR - Hepatic function panel - Magnesium   Screening cholesterol level - Lipid panel   Screening for blood or protein in urine - Urinalysis, Routine w reflex microscopic (not at [facility]) - Microalbumin / creatinine urine ratio  BMI 26.0-26.9,adult  Discussed med's effects and SE's. Screening labs and tests as requested with regular follow-up as recommended. Over 40 minutes of exam, counseling, chart review, and complex, high level critical decision making was performed this visit.  Future Appointments  Date Time Provider [place]  04/26/2018  3:00 PM Vicie Mutters, PA-C [facility] None    HPI  47 y.o. female  presents for a complete physical.  Her blood pressure has been controlled at home, today their BP is BP: 122/76 She does workout, 30 mins workout, walking, yoga.  She denies chest pain, shortness of breath, dizziness.  She has had some fatigue, B12 has helped, wants to start exercising again, states she sleeps well.  She follows with Derm, follows once a year, had melanoma removed left shoulder 2013.  She is not on cholesterol medication and denies myalgias. Her cholesterol is not at goal. The cholesterol last visit was:   Lab Results  Component Value Date   CHOL 170 04/08/2016   HDL 46 (L) 04/08/2016   LDLCALC 98 04/08/2016   TRIG 128 04/08/2016   CHOLHDL 3.7 04/08/2016    Last A1C in the office was:  Lab Results  Component Value Date   HGBA1C 5.3 03/24/2014   Patient is on Vitamin D  supplement and fish oil combo.   Lab Results  Component Value Date   VD25OH 26 (L) 04/08/2016    Lab Results  Component Value Date   ENIDPOEU23 536 04/08/2016   BMI is Body mass index is 26.33 kg/m., she is working on diet and exercise. Wt Readings from Last 3 Encounters:  04/14/17 153 lb 6.4 oz (69.6 kg)  02/10/17 149 lb (67.6 kg)  02/09/17 143 lb (64.9 kg)   .   Current Medications:  Current Outpatient Medications on File Prior to Visit  Medication Sig Dispense Refill  . Cholecalciferol (VITAMIN D PO) Take 1 tablet daily by mouth.    . Cyanocobalamin (VITAMIN B-12) 1000 MCG SUBL Place under the tongue daily.    Marland Kitchen Fexofenadine HCl (ALLEGRA PO) Take by mouth.      . fluticasone (FLONASE) 50 MCG/ACT nasal spray Place 2 sprays into both nostrils daily. 16 g 3   No current facility-administered medications on file prior to visit.    Health Maintenance:   Immunization History  Administered Date(s) Administered  . Influenza Split 02/06/2015  . Influenza-Unspecified 01/05/2014  . PPD Test 03/21/2013, 03/24/2014  . Td 04/08/2003  . Tdap 03/24/2014   Tetanus: 2015 Pneumovax: N/A Prevnar 13:  N/A Flu vaccine: 2018  Zostavax: N/A  LMP: Patient's last menstrual period was 04/02/2016. Pap: 02/09/2017 every 5 years neg PAP/neg HPV, Dr. Annamaria Boots MGM: 12/2016 DEXA: N/A Colonoscopy:N/A EGD: N/A  Last Dental Exam: Dr. [name] Last Eye Exam: [facility], contacts [place] VISIT Patient Care Team: [name],  MD as PCP - General (Internal Medicine) [name], FNP as Nurse Practitioner (Obstetrics and Gynecology) [name], MD as Consulting Physician (Dermatology) [name], MD as Referring Physician (Allergy and Immunology)  Medical History:  Past Medical History:  Diagnosis Date  . Allergy   . B12 deficiency anemia    Allergies No Known Allergies  SURGICAL HISTORY She  has a past surgical history that includes Cesarean section; Tubal ligation; and  Melanoma excised.   FAMILY HISTORY Her family history includes Cancer in her maternal grandmother, paternal grandfather, and paternal grandmother; Diabetes in her father; Heart disease in her maternal grandfather; Hypertension in her father and mother; Stroke in her mother.   SOCIAL HISTORY She  reports that she quit smoking about 19 years ago. she has never used smokeless tobacco. She reports that she drinks alcohol. She reports that she does not use drugs.   Review of Systems: Review of Systems  Constitutional: Negative for chills, diaphoresis, fever, malaise/fatigue and weight loss.  HENT: Negative.   Eyes: Negative.   Respiratory: Negative.  Negative for cough and shortness of breath.   Cardiovascular: Negative for chest pain, palpitations, claudication, leg swelling and PND.  Gastrointestinal: Negative.   Genitourinary: Negative.   Musculoskeletal: Negative.   Skin: Negative.   Neurological: Negative.  Negative for weakness.  Endo/Heme/Allergies: Negative.   Psychiatric/Behavioral: Negative.     Physical Exam: Estimated body mass index is 26.33 kg/m as calculated from the following:   Height as of this encounter: 5\' 4"  (1.626 m).   Weight as of this encounter: 153 lb 6.4 oz (69.6 kg). BP 122/76   Pulse 60   Temp 97.7 F (36.5 C)   Resp 16   Ht 5\' 4"  (1.626 m)   Wt 153 lb 6.4 oz (69.6 kg)   LMP 04/02/2016   SpO2 99%   BMI 26.33 kg/m  General Appearance: Well nourished, in no apparent distress.  Eyes: PERRLA, EOMs, conjunctiva no swelling or erythema, normal fundi and vessels.  Sinuses: No Frontal/maxillary tenderness  ENT/Mouth: Ext aud canals clear, normal light reflex with TMs without erythema, bulging. Good dentition. No erythema, swelling, or exudate on post pharynx. Tonsils not swollen or erythematous. Hearing normal.  Neck: Supple, thyroid normal. No bruits  Respiratory: Respiratory effort normal, BS equal bilaterally without rales, rhonchi, wheezing or  stridor.  Cardio: RRR without murmurs, rubs or gallops. Brisk peripheral pulses without edema.  Chest: symmetric, with normal excursions and percussion.  Breasts: defer Abdomen: Soft, nontender, no guarding, rebound, hernias, masses, or organomegaly.  Lymphatics: Non tender without lymphadenopathy.  Genitourinary: defer Musculoskeletal: Full ROM all peripheral extremities,5/5 strength, and normal gait.  Skin: Warm, dry without rashes, lesions, ecchymosis. Neuro: Cranial nerves intact, reflexes equal bilaterally. Normal muscle tone, no cerebellar symptoms. Sensation intact.  Psych: Awake and oriented X 3, normal affect, Insight and Judgment appropriate.   EKG: defer AORTA SCAN: defer  Vicie Mutters 3:10 PM [facility] Internal [facility]

## 2017-04-14 ENCOUNTER — Ambulatory Visit (INDEPENDENT_AMBULATORY_CARE_PROVIDER_SITE_OTHER): Payer: 59 | Admitting: Physician Assistant

## 2017-04-14 ENCOUNTER — Encounter: Payer: Self-pay | Admitting: Physician Assistant

## 2017-04-14 VITALS — BP 122/76 | HR 60 | Temp 97.7°F | Resp 16 | Ht 64.0 in | Wt 153.4 lb

## 2017-04-14 DIAGNOSIS — Z79899 Other long term (current) drug therapy: Secondary | ICD-10-CM

## 2017-04-14 DIAGNOSIS — Z0000 Encounter for general adult medical examination without abnormal findings: Secondary | ICD-10-CM

## 2017-04-14 DIAGNOSIS — D649 Anemia, unspecified: Secondary | ICD-10-CM

## 2017-04-14 DIAGNOSIS — Z1389 Encounter for screening for other disorder: Secondary | ICD-10-CM

## 2017-04-14 DIAGNOSIS — E538 Deficiency of other specified B group vitamins: Secondary | ICD-10-CM

## 2017-04-14 DIAGNOSIS — Z13220 Encounter for screening for lipoid disorders: Secondary | ICD-10-CM

## 2017-04-14 DIAGNOSIS — E559 Vitamin D deficiency, unspecified: Secondary | ICD-10-CM | POA: Diagnosis not present

## 2017-04-14 DIAGNOSIS — Z136 Encounter for screening for cardiovascular disorders: Secondary | ICD-10-CM

## 2017-04-14 DIAGNOSIS — Z6826 Body mass index (BMI) 26.0-26.9, adult: Secondary | ICD-10-CM

## 2017-04-14 NOTE — Patient Instructions (Addendum)
   Colon cancer is 3rd most diagnosed cancer and 2nd leading cause of death in both men and women 49 years of age and older despite being one of the most preventable and treatable cancers if found early.  4 of out 5 people diagnosed with colon cancer have NO prior family history.  When caught EARLY 90% of colon cancer is curable.      Vitamin D goal is between 60-80  Please make sure that you are taking your Vitamin D as directed.   It is very important as a natural anti-inflammatory   helping hair, skin, and nails, as well as reducing stroke and heart attack risk.   It helps your bones and helps with mood.  We want you on at least 5000 IU daily  It also decreases numerous cancer risks so please take it as directed.   Low Vit D is associated with a 200-300% higher risk for CANCER   and 200-300% higher risk for HEART   ATTACK  &  STROKE.    .....................................Marland Kitchen  It is also associated with higher death rate at younger ages,   autoimmune diseases like Rheumatoid arthritis, Lupus, Multiple Sclerosis.     Also many other serious conditions, like depression, Alzheimer's  Dementia, infertility, muscle aches, fatigue, fibromyalgia - just to name a few.  +++++++++++++++++++  Can get liquid vitamin D from [place] here in [place] at  [facility] alternatives [address], [place], [place] [address] Or you can try earth fare

## 2017-04-15 LAB — IRON, TOTAL/TOTAL IRON BINDING CAP
%SAT: 10 % — AB (ref 11–50)
Iron: 46 ug/dL (ref 40–190)
TIBC: 468 mcg/dL (calc) — ABNORMAL HIGH (ref 250–450)

## 2017-04-15 LAB — HEPATIC FUNCTION PANEL
AG RATIO: 1.4 (calc) (ref 1.0–2.5)
ALT: 9 U/L (ref 6–29)
AST: 13 U/L (ref 10–35)
Albumin: 4.3 g/dL (ref 3.6–5.1)
Alkaline phosphatase (APISO): 65 U/L (ref 33–115)
BILIRUBIN DIRECT: 0.1 mg/dL (ref 0.0–0.2)
BILIRUBIN INDIRECT: 0.2 mg/dL (ref 0.2–1.2)
BILIRUBIN TOTAL: 0.3 mg/dL (ref 0.2–1.2)
Globulin: 3 g/dL (calc) (ref 1.9–3.7)
Total Protein: 7.3 g/dL (ref 6.1–8.1)

## 2017-04-15 LAB — CBC WITH DIFFERENTIAL/PLATELET
BASOS PCT: 0.6 %
Basophils Absolute: 41 cells/uL (ref 0–200)
EOS ABS: 150 {cells}/uL (ref 15–500)
Eosinophils Relative: 2.2 %
HEMATOCRIT: 35.6 % (ref 35.0–45.0)
HEMOGLOBIN: 11.6 g/dL — AB (ref 11.7–15.5)
LYMPHS ABS: 2135 {cells}/uL (ref 850–3900)
MCH: 27.6 pg (ref 27.0–33.0)
MCHC: 32.6 g/dL (ref 32.0–36.0)
MCV: 84.8 fL (ref 80.0–100.0)
MONOS PCT: 7.4 %
MPV: 10.8 fL (ref 7.5–12.5)
NEUTROS ABS: 3971 {cells}/uL (ref 1500–7800)
Neutrophils Relative %: 58.4 %
Platelets: 348 10*3/uL (ref 140–400)
RBC: 4.2 10*6/uL (ref 3.80–5.10)
RDW: 13 % (ref 11.0–15.0)
Total Lymphocyte: 31.4 %
WBC: 6.8 10*3/uL (ref 3.8–10.8)
WBCMIX: 503 {cells}/uL (ref 200–950)

## 2017-04-15 LAB — TSH: TSH: 1.42 m[IU]/L

## 2017-04-15 LAB — BASIC METABOLIC PANEL WITH GFR
BUN: 10 mg/dL (ref 7–25)
CHLORIDE: 103 mmol/L (ref 98–110)
CO2: 27 mmol/L (ref 20–32)
Calcium: 9.2 mg/dL (ref 8.6–10.2)
Creat: 0.68 mg/dL (ref 0.50–1.10)
GFR, EST AFRICAN AMERICAN: 120 mL/min/{1.73_m2} (ref >=60)
GFR, Est Non African American: 103 mL/min/{1.73_m2} (ref >=60)
Glucose, Bld: 90 mg/dL (ref 65–99)
Potassium: 3.9 mmol/L (ref 3.5–5.3)
Sodium: 137 mmol/L (ref 135–146)

## 2017-04-15 LAB — URINALYSIS, ROUTINE W REFLEX MICROSCOPIC
BILIRUBIN URINE: NEGATIVE
GLUCOSE, UA: NEGATIVE
HGB URINE DIPSTICK: NEGATIVE
KETONES UR: NEGATIVE
Leukocytes, UA: NEGATIVE
Nitrite: NEGATIVE
PROTEIN: NEGATIVE
Specific Gravity, Urine: 1.013 (ref 1.001–1.03)
pH: 6 (ref 5.0–8.0)

## 2017-04-15 LAB — VITAMIN D 25 HYDROXY (VIT D DEFICIENCY, FRACTURES): VIT D 25 HYDROXY: 26 ng/mL — AB (ref 30–100)

## 2017-04-15 LAB — MAGNESIUM: MAGNESIUM: 2 mg/dL (ref 1.5–2.5)

## 2017-04-15 LAB — LIPID PANEL
CHOL/HDL RATIO: 3.1 (calc) (ref <5.0)
Cholesterol: 186 mg/dL (ref <200)
HDL: 60 mg/dL (ref >50)
LDL CHOLESTEROL (CALC): 106 mg/dL — AB
NON-HDL CHOLESTEROL (CALC): 126 mg/dL (ref <130)
Triglycerides: 100 mg/dL (ref <150)

## 2017-04-15 LAB — MICROALBUMIN / CREATININE URINE RATIO
Creatinine, Urine: 74 mg/dL (ref 20–275)
Microalb Creat Ratio: 3 mcg/mg creat (ref <30)
Microalb, Ur: 0.2 mg/dL

## 2017-04-15 LAB — VITAMIN B12: VITAMIN B 12: 352 pg/mL (ref 200–1100)

## 2017-05-26 DIAGNOSIS — L918 Other hypertrophic disorders of the skin: Secondary | ICD-10-CM | POA: Diagnosis not present

## 2017-05-26 DIAGNOSIS — L720 Epidermal cyst: Secondary | ICD-10-CM | POA: Diagnosis not present

## 2017-05-26 DIAGNOSIS — Z85828 Personal history of other malignant neoplasm of skin: Secondary | ICD-10-CM | POA: Diagnosis not present

## 2017-05-26 DIAGNOSIS — L821 Other seborrheic keratosis: Secondary | ICD-10-CM | POA: Diagnosis not present

## 2017-09-07 ENCOUNTER — Encounter: Payer: Self-pay | Admitting: Women's Health

## 2017-09-07 ENCOUNTER — Ambulatory Visit: Payer: 59 | Admitting: Women's Health

## 2017-09-07 VITALS — BP 122/80

## 2017-09-07 DIAGNOSIS — N898 Other specified noninflammatory disorders of vagina: Secondary | ICD-10-CM

## 2017-09-07 DIAGNOSIS — R350 Frequency of micturition: Secondary | ICD-10-CM

## 2017-09-07 DIAGNOSIS — B3731 Acute candidiasis of vulva and vagina: Secondary | ICD-10-CM

## 2017-09-07 DIAGNOSIS — B373 Candidiasis of vulva and vagina: Secondary | ICD-10-CM

## 2017-09-07 LAB — WET PREP FOR TRICH, YEAST, CLUE

## 2017-09-07 MED ORDER — FLUCONAZOLE 150 MG PO TABS
150.0000 mg | ORAL_TABLET | Freq: Once | ORAL | 1 refills | Status: AC
Start: 2017-09-07 — End: 2017-09-07

## 2017-09-07 NOTE — Progress Notes (Signed)
47 year old MWF G3 P4 presents with complaint of increased vaginal discharge, vaginal irritation with  itching.  Has had the symptoms intermittently over the past month did use over-the-counter Monistat with some relief.  Denies urinary symptoms, abdominal pain, back pain or fever.  Regular monthly cycle/BTL.  Medical problems include seasonal allergies.  Exam: Ears well.  Ears well.  No CVAT.  Abdomen soft, external genitalia mild erythema at introitus, speculum exam moderate amount of a white discharge without odor, wet prep negative.  Bimanual no CMT or adnexal tenderness. UA: +2 leukocytes, negative nitrites, +2 ketones, 20-40 WBCs, no RBCs, 10-20 squamous epithelials, moderate bacteria  Probable yeast vaginitis  Plan: Reviewed normality of wet prep, Diflucan 150 p.o. x1 dose, call if no relief of symptoms.  Urine culture pending.  Reviewed importance of increasing fluids.

## 2017-09-07 NOTE — Addendum Note (Signed)
Addended by: Lorine Bears on: 09/07/2017 04:22 PM   Modules accepted: Orders

## 2017-09-07 NOTE — Patient Instructions (Signed)

## 2017-09-13 LAB — URINALYSIS, COMPLETE W/RFL CULTURE
Bilirubin Urine: NEGATIVE
GLUCOSE, UA: NEGATIVE
HGB URINE DIPSTICK: NEGATIVE
Hyaline Cast: NONE SEEN /LPF
NITRITES URINE, INITIAL: NEGATIVE
PH: 7 (ref 5.0–8.0)
PROTEIN: NEGATIVE
RBC / HPF: NONE SEEN /HPF (ref 0–2)
Specific Gravity, Urine: 1.02 (ref 1.001–1.03)

## 2017-09-13 LAB — URINE CULTURE
MICRO NUMBER:: 90670173
SPECIMEN QUALITY: ADEQUATE

## 2017-09-13 LAB — CULTURE INDICATED

## 2017-09-14 ENCOUNTER — Other Ambulatory Visit: Payer: Self-pay | Admitting: Women's Health

## 2017-09-14 MED ORDER — SULFAMETHOXAZOLE-TRIMETHOPRIM 800-160 MG PO TABS: 1.0000 | ORAL_TABLET | Freq: Two times a day (BID) | ORAL | 0 refills | Status: DC

## 2017-10-07 DIAGNOSIS — N390 Urinary tract infection, site not specified: Secondary | ICD-10-CM | POA: Diagnosis not present

## 2017-10-08 DIAGNOSIS — N390 Urinary tract infection, site not specified: Secondary | ICD-10-CM | POA: Diagnosis not present

## 2017-10-08 DIAGNOSIS — R300 Dysuria: Secondary | ICD-10-CM | POA: Diagnosis not present

## 2017-10-21 ENCOUNTER — Other Ambulatory Visit: Payer: Self-pay | Admitting: Internal Medicine

## 2017-10-21 DIAGNOSIS — N3000 Acute cystitis without hematuria: Secondary | ICD-10-CM

## 2017-10-22 ENCOUNTER — Other Ambulatory Visit: Payer: Self-pay | Admitting: Adult Health

## 2017-10-22 ENCOUNTER — Other Ambulatory Visit: Payer: 59

## 2017-10-22 DIAGNOSIS — N3000 Acute cystitis without hematuria: Secondary | ICD-10-CM | POA: Diagnosis not present

## 2017-10-23 ENCOUNTER — Encounter: Payer: Self-pay | Admitting: Internal Medicine

## 2017-10-23 ENCOUNTER — Encounter: Payer: Self-pay | Admitting: Adult Health

## 2017-10-23 LAB — URINALYSIS, ROUTINE W REFLEX MICROSCOPIC
Bilirubin Urine: NEGATIVE
Glucose, UA: NEGATIVE
Hgb urine dipstick: NEGATIVE
Ketones, ur: NEGATIVE
Leukocytes, UA: NEGATIVE
Nitrite: NEGATIVE
PROTEIN: NEGATIVE
SPECIFIC GRAVITY, URINE: 1.026 (ref 1.001–1.03)

## 2017-10-23 LAB — URINE CULTURE
MICRO NUMBER: 90853981
SPECIMEN QUALITY: ADEQUATE

## 2017-10-25 ENCOUNTER — Encounter: Payer: Self-pay | Admitting: Internal Medicine

## 2018-01-06 DIAGNOSIS — Z1231 Encounter for screening mammogram for malignant neoplasm of breast: Secondary | ICD-10-CM | POA: Diagnosis not present

## 2018-01-06 LAB — HM MAMMOGRAPHY

## 2018-01-12 ENCOUNTER — Encounter: Payer: Self-pay | Admitting: *Deleted

## 2018-02-03 ENCOUNTER — Ambulatory Visit: Payer: 59 | Admitting: Adult Health

## 2018-02-03 ENCOUNTER — Encounter: Payer: Self-pay | Admitting: Adult Health

## 2018-02-03 VITALS — BP 110/70 | HR 105 | Temp 97.5°F | Ht 64.0 in | Wt 164.0 lb

## 2018-02-03 DIAGNOSIS — R300 Dysuria: Secondary | ICD-10-CM | POA: Diagnosis not present

## 2018-02-03 MED ORDER — NITROFURANTOIN MONOHYD MACRO 100 MG PO CAPS: 100.0000 mg | ORAL_CAPSULE | Freq: Two times a day (BID) | ORAL | 0 refills | Status: DC

## 2018-02-03 NOTE — Progress Notes (Signed)
Assessment and Plan:  Anna Hernandez was seen today for urinary tract infection.  Diagnoses and all orders for this visit:  Dysuria UTI versus OAB versus vaginal dryness - will check UA, C&S. Will start ABX now due to pain; if positive UTI will recheck in 2-4 weeks due to recent recurrent UTI - push hydration, hygiene disucssed, OK to start OTC azo for comfort for the next 24 hours  -     Urinalysis w microscopic + reflex cultur -     nitrofurantoin, macrocrystal-monohydrate, (MACROBID) 100 MG capsule; Take 1 capsule (100 mg total) by mouth 2 (two) times daily.  Further disposition pending results of labs. Discussed med's effects and SE's.   Over 15 minutes of exam, counseling, chart review, and critical decision making was performed.   Future Appointments  Date Time Provider [place]  04/26/2018  3:00 PM Vicie Mutters, PA-C [facility] None    ------------------------------------------------------------------------------------------------------------------   HPI Blood pressure 110/70, pulse (!) 105, temperature (!) 97.5 F (36.4 C), height 5\' 4"  (1.626 m), weight 164 lb (74.4 kg), SpO2 98 %.   47 y.o.female presents for evaluation of urinary symptoms that began 5 days ago with frequency, urgency, lower abdominal pressure. She reports mild dysuria at the end of urine stream. She put off coming in as was improving with pushing water. She reports intermittent foul odor to urine. She denies fever/chills. Does report intermittent lower abdominal pressure. She denies hematuria, flank pain.    She did have 1-2 UTI episodes this summer, was on septra x 3 days then cipro x 7 days in July. This frequency of UTIs is unusual for her.   She is not recently sexually active, last 3 years ago. She has pelvic exams with GYN annually and no reported abnormalities.   Past Medical History:  Diagnosis Date  . Allergy   . B12 deficiency anemia      No Known Allergies  Current Outpatient  Medications on File Prior to Visit  Medication Sig  . Cholecalciferol (VITAMIN D PO) Take 1 tablet daily by mouth.  . Cyanocobalamin (VITAMIN B-12) 1000 MCG SUBL Place under the tongue daily.  Marland Kitchen Fexofenadine HCl (ALLEGRA PO) Take by mouth.    . fluticasone (FLONASE) 50 MCG/ACT nasal spray Place 2 sprays into both nostrils daily.  Marland Kitchen sulfamethoxazole-trimethoprim (BACTRIM DS,SEPTRA DS) 800-160 MG tablet Take 1 tablet by mouth 2 (two) times daily.   No current facility-administered medications on file prior to visit.     ROS: all negative except above.   Physical Exam:  BP 110/70   Pulse (!) 105   Temp (!) 97.5 F (36.4 C)   Ht 5\' 4"  (1.626 m)   Wt 164 lb (74.4 kg)   SpO2 98%   BMI 28.15 kg/m   General Appearance: Well nourished, in no apparent distress. Eyes: conjunctiva no swelling or erythema ENT/Mouth: Hearing normal.  Neck: Supple Respiratory: Respiratory effort normal Cardio: RRR with no MRGs.  Abdomen: Soft, + BS.  Non tender, no guarding, rebound, hernias, masses. Musculoskeletal: normal gait. No CVA tenderness.   Skin: Warm, dry without rashes, lesions, ecchymosis.  Neuro: Normal muscle tone Psych: Awake and oriented X 3, normal affect, Insight and Judgment appropriate.     Izora Ribas, NP 4:57 PM [facility] Adult [facility] Adolescent Internal Medicine

## 2018-02-03 NOTE — Patient Instructions (Addendum)
Urinary Tract Infection, Adult A urinary tract infection (UTI) is an infection of any part of the urinary tract. The urinary tract includes the:  Kidneys.  Ureters.  Bladder.  Urethra.  These organs make, store, and get rid of pee (urine) in the body. Follow these instructions at home:  Take over-the-counter and prescription medicines only as told by your doctor.  If you were prescribed an antibiotic medicine, take it as told by your doctor. Do not stop taking the antibiotic even if you start to feel better.  Avoid the following drinks: ? Alcohol. ? Caffeine. ? Tea. ? Carbonated drinks.  Drink enough fluid to keep your pee clear or pale yellow.  Keep all follow-up visits as told by your doctor. This is important.  Make sure to: ? Empty your bladder often and completely. Do not to hold pee for long periods of time. ? Empty your bladder before and after sex. ? Wipe from front to back after a bowel movement if you are female. Use each tissue one time when you wipe. Contact a doctor if:  You have back pain.  You have a fever.  You feel sick to your stomach (nauseous).  You throw up (vomit).  Your symptoms do not get better after 3 days.  Your symptoms go away and then come back. Get help right away if:  You have very bad back pain.  You have very bad lower belly (abdominal) pain.  You are throwing up and cannot keep down any medicines or water. This information is not intended to replace advice given to you by your health care provider. Make sure you discuss any questions you have with your health care provider. Document Released: 09/10/2007 Document Revised: 08/30/2015 Document Reviewed: 02/12/2015 Elsevier Interactive Patient Education  2018 Elsevier Inc.  Nitrofurantoin tablets or capsules What is this medicine? NITROFURANTOIN (nye troe fyoor AN toyn) is an antibiotic. It is used to treat urinary tract infections. This medicine may be used for other  purposes; ask your health care provider or pharmacist if you have questions. COMMON BRAND NAME(S): Macrobid, Macrodantin, Urotoin What should I tell my health care provider before I take this medicine? They need to know if you have any of these conditions: -anemia -diabetes -glucose-6-phosphate dehydrogenase deficiency -kidney disease -liver disease -lung disease -other chronic illness -an unusual or allergic reaction to nitrofurantoin, other antibiotics, other medicines, foods, dyes or preservatives -pregnant or trying to get pregnant -breast-feeding How should I use this medicine? Take this medicine by mouth with a glass of water. Follow the directions on the prescription label. Take this medicine with food or milk. Take your doses at regular intervals. Do not take your medicine more often than directed. Do not stop taking except on your doctor's advice. Talk to your pediatrician regarding the use of this medicine in children. While this drug may be prescribed for selected conditions, precautions do apply. Overdosage: If you think you have taken too much of this medicine contact a poison control center or emergency room at once. NOTE: This medicine is only for you. Do not share this medicine with others. What if I miss a dose? If you miss a dose, take it as soon as you can. If it is almost time for your next dose, take only that dose. Do not take double or extra doses. What may interact with this medicine? -antacids containing magnesium trisilicate -probenecid -quinolone antibiotics like ciprofloxacin, lomefloxacin, norfloxacin and ofloxacin -sulfinpyrazone This list may not describe all possible interactions. Give   your health care provider a list of all the medicines, herbs, non-prescription drugs, or dietary supplements you use. Also tell them if you smoke, drink alcohol, or use illegal drugs. Some items may interact with your medicine. What should I watch for while using this  medicine? Tell your doctor or health care professional if your symptoms do not improve or if you get new symptoms. Drink several glasses of water a day. If you are taking this medicine for a long time, visit your doctor for regular checks on your progress. If you are diabetic, you may get a false positive result for sugar in your urine with certain brands of urine tests. Check with your doctor. What side effects may I notice from receiving this medicine? Side effects that you should report to your doctor or health care professional as soon as possible: -allergic reactions like skin rash or hives, swelling of the face, lips, or tongue -chest pain -cough -difficulty breathing -dizziness, drowsiness -fever or infection -joint aches or pains -pale or blue-tinted skin -redness, blistering, peeling or loosening of the skin, including inside the mouth -tingling, burning, pain, or numbness in hands or feet -unusual bleeding or bruising -unusually weak or tired -yellowing of eyes or skin Side effects that usually do not require medical attention (report to your doctor or health care professional if they continue or are bothersome): -dark urine -diarrhea -headache -loss of appetite -nausea or vomiting -temporary hair loss This list may not describe all possible side effects. Call your doctor for medical advice about side effects. You may report side effects to FDA at 1-800-FDA-1088. Where should I keep my medicine? Keep out of the reach of children. Store at room temperature between 15 and 30 degrees C (59 and 86 degrees F). Protect from light. Throw away any unused medicine after the expiration date. NOTE: This sheet is a summary. It may not cover all possible information. If you have questions about this medicine, talk to your doctor, pharmacist, or health care provider.  2018 Elsevier/Gold Standard (2007-10-13 15:56:47)  

## 2018-02-04 ENCOUNTER — Encounter: Payer: Self-pay | Admitting: Adult Health

## 2018-02-04 DIAGNOSIS — R82998 Other abnormal findings in urine: Secondary | ICD-10-CM | POA: Insufficient documentation

## 2018-02-04 LAB — URINALYSIS W MICROSCOPIC + REFLEX CULTURE
BACTERIA UA: NONE SEEN /HPF
Bilirubin Urine: NEGATIVE
Glucose, UA: NEGATIVE
HYALINE CAST: NONE SEEN /LPF
Hgb urine dipstick: NEGATIVE
Ketones, ur: NEGATIVE
LEUKOCYTE ESTERASE: NEGATIVE
Nitrites, Initial: NEGATIVE
Protein, ur: NEGATIVE
SPECIFIC GRAVITY, URINE: 1.028 (ref 1.001–1.03)
pH: 5.5 (ref 5.0–8.0)

## 2018-02-04 LAB — NO CULTURE INDICATED

## 2018-03-01 ENCOUNTER — Encounter: Payer: Self-pay | Admitting: Obstetrics & Gynecology

## 2018-03-01 ENCOUNTER — Ambulatory Visit: Payer: 59 | Admitting: Obstetrics & Gynecology

## 2018-03-01 VITALS — BP 122/80

## 2018-03-01 DIAGNOSIS — N898 Other specified noninflammatory disorders of vagina: Secondary | ICD-10-CM

## 2018-03-01 DIAGNOSIS — R300 Dysuria: Secondary | ICD-10-CM | POA: Diagnosis not present

## 2018-03-01 LAB — WET PREP FOR TRICH, YEAST, CLUE

## 2018-03-01 NOTE — Patient Instructions (Signed)
1. Dysuria Urine analysis completely negative.  Patient reassured. - Urinalysis,Complete w/RFL Culture  2. Vaginal itching Wet prep completely negative.  Reassured.  Recommend probiotic tablet vaginally once a week to improve her flora.  Mild vaginal irritation/folliculitis at the left posterior vulva.  Will apply hydrocortisone 1% cream daily until symptoms resolve. - WET PREP FOR [place], YEAST, CLUE  [place], it was a pleasure meeting you today!

## 2018-03-01 NOTE — Progress Notes (Signed)
    Anna Hernandez 03-27-1971 [id number]        47 y.o.  G3P3L4 Married  RP: Vaginal itching and burning with urination  HPI: Patient had 2 cystitis in the last 6 months.  Each time confirmed by urine culture and treated with antibiotics in June and then in July 2019.  No cystitis since then.  Complains of vaginal itching with increased discharge currently and burning externally when urinating.  No urinary frequency.  No pelvic pain.  No fever.  Abstinent for the last 4 years.   OB History  Gravida Para Term Preterm AB Living  3 3       4   SAB TAB Ectopic Multiple Live Births        1      # Outcome Date GA Lbr Len/2nd Weight Sex Delivery Anes PTL Lv  3 Para           2 Para           1 Para             Past medical history,surgical history, problem list, medications, allergies, family history and social history were all reviewed and documented in the EPIC chart.   Directed ROS with pertinent positives and negatives documented in the history of present illness/assessment and plan.  Exam:  Vitals:   03/01/18 1032  BP: 122/80   General appearance:  Normal  Abdomen: Normal  CVAT: Negative bilaterally  Gynecologic exam: Vulva normal, except for mild erythema/folliculitis at the left posterior vulva.  Speculum: Cervix and vagina normal.  Increased vaginal discharge.  Wet prep done.  Wet prep: Negative  Urine analysis: Completely negative   Assessment/Plan:  47 y.o. G3P3   1. Dysuria Urine analysis completely negative.  Patient reassured. - Urinalysis,Complete w/RFL Culture  2. Vaginal itching Wet prep completely negative.  Reassured.  Recommend probiotic tablet vaginally once a week to improve her flora.  Mild vaginal irritation/folliculitis at the left posterior vulva.  Will apply hydrocortisone 1% cream daily until symptoms resolve. - WET PREP FOR [place], YEAST, CLUE  Counseling on above issues and coordination of care more than 50% for 15  minutes.  Princess Bruins MD, 10:34 AM 03/01/2018

## 2018-03-02 LAB — URINALYSIS, COMPLETE W/RFL CULTURE
Bacteria, UA: NONE SEEN /HPF
Bilirubin Urine: NEGATIVE
GLUCOSE, UA: NEGATIVE
HGB URINE DIPSTICK: NEGATIVE
Hyaline Cast: NONE SEEN /LPF
Ketones, ur: NEGATIVE
Leukocyte Esterase: NEGATIVE
NITRITES URINE, INITIAL: NEGATIVE
PH: 5.5 (ref 5.0–8.0)
Protein, ur: NEGATIVE
RBC / HPF: NONE SEEN /HPF (ref 0–2)
SPECIFIC GRAVITY, URINE: 1.025 (ref 1.001–1.03)
Squamous Epithelial / LPF: NONE SEEN /HPF (ref <=5)
WBC, UA: NONE SEEN /HPF (ref 0–5)

## 2018-03-02 LAB — NO CULTURE INDICATED

## 2018-04-26 ENCOUNTER — Encounter: Payer: Self-pay | Admitting: Physician Assistant

## 2018-05-19 ENCOUNTER — Encounter: Payer: 59 | Admitting: Obstetrics & Gynecology

## 2018-05-26 ENCOUNTER — Other Ambulatory Visit: Payer: Self-pay

## 2018-05-26 DIAGNOSIS — Z1211 Encounter for screening for malignant neoplasm of colon: Secondary | ICD-10-CM | POA: Diagnosis not present

## 2018-05-26 LAB — POC HEMOCCULT BLD/STL (HOME/3-CARD/SCREEN)
Card #2 Fecal Occult Blod, POC: NEGATIVE
Card #3 Fecal Occult Blood, POC: NEGATIVE
Fecal Occult Blood, POC: NEGATIVE

## 2018-05-27 ENCOUNTER — Encounter: Payer: Self-pay | Admitting: Physician Assistant

## 2018-06-09 ENCOUNTER — Encounter: Payer: Self-pay | Admitting: Obstetrics & Gynecology

## 2018-06-09 ENCOUNTER — Ambulatory Visit: Payer: 59 | Admitting: Obstetrics & Gynecology

## 2018-06-09 VITALS — BP 124/80 | Ht 63.0 in | Wt 160.0 lb

## 2018-06-09 DIAGNOSIS — Z01419 Encounter for gynecological examination (general) (routine) without abnormal findings: Secondary | ICD-10-CM | POA: Diagnosis not present

## 2018-06-09 DIAGNOSIS — R87610 Atypical squamous cells of undetermined significance on cytologic smear of cervix (ASC-US): Secondary | ICD-10-CM | POA: Diagnosis not present

## 2018-06-09 DIAGNOSIS — Z9851 Tubal ligation status: Secondary | ICD-10-CM | POA: Diagnosis not present

## 2018-06-09 DIAGNOSIS — E663 Overweight: Secondary | ICD-10-CM

## 2018-06-09 NOTE — Progress Notes (Signed)
Anna Hernandez [birth year] [id number]   History:    48 y.o. G3P3L4 Married.  S/P TL.  Children twin 97 yo, 20 yo and 101 yo.  RP:  Established patient presenting for annual gyn exam   HPI: Menses light every month. No BTB.  No pelvic pain.  No pain with IC.  Urine/BMs normal.  Breasts normal.  BMI 28.34.  Not exercising regularly.  Health labs with Fam MD.  Past medical history,surgical history, family history and social history were all reviewed and documented in the EPIC chart.  Gynecologic History Patient's last menstrual period was 05/26/2018. Contraception: tubal ligation Last Pap: 02/2017. Results were: Negative Last mammogram: 01/2018. Results were: Negative Bone Density: Never Colonoscopy: Never  Obstetric History OB History  Gravida Para Term Preterm AB Living  3 3       4   SAB TAB Ectopic Multiple Live Births        1      # Outcome Date GA Lbr Len/2nd Weight Sex Delivery Anes PTL Lv  3 Para           2 Para           1 Para              ROS: A ROS was performed and pertinent positives and negatives are included in the history.  GENERAL: No fevers or chills. HEENT: No change in vision, no earache, sore throat or sinus congestion. NECK: No pain or stiffness. CARDIOVASCULAR: No chest pain or pressure. No palpitations. PULMONARY: No shortness of breath, cough or wheeze. GASTROINTESTINAL: No abdominal pain, nausea, vomiting or diarrhea, melena or bright red blood per rectum. GENITOURINARY: No urinary frequency, urgency, hesitancy or dysuria. MUSCULOSKELETAL: No joint or muscle pain, no back pain, no recent trauma. DERMATOLOGIC: No rash, no itching, no lesions. ENDOCRINE: No polyuria, polydipsia, no heat or cold intolerance. No recent change in weight. HEMATOLOGICAL: No anemia or easy bruising or bleeding. NEUROLOGIC: No headache, seizures, numbness, tingling or weakness. PSYCHIATRIC: No depression, no loss of interest in normal activity or change in sleep pattern.     Exam:   BP 124/80   Ht 5\' 3"  (1.6 m)   Wt 160 lb (72.6 kg)   LMP 05/26/2018   BMI 28.34 kg/m   Body mass index is 28.34 kg/m.  General appearance : Well developed well nourished female. No acute distress HEENT: Eyes: no retinal hemorrhage or exudates,  Neck supple, trachea midline, no carotid bruits, no thyroidmegaly Lungs: Clear to auscultation, no rhonchi or wheezes, or rib retractions  Heart: Regular rate and rhythm, no murmurs or gallops Breast:Examined in sitting and supine position were symmetrical in appearance, no palpable masses or tenderness,  no skin retraction, no nipple inversion, no nipple discharge, no skin discoloration, no axillary or supraclavicular lymphadenopathy Abdomen: no palpable masses or tenderness, no rebound or guarding Extremities: no edema or skin discoloration or tenderness  Pelvic: Vulva: Normal             Vagina: No gross lesions or discharge  Cervix: No gross lesions or discharge.  Pap reflex done.  Uterus  AV, normal size, shape and consistency, non-tender and mobile  Adnexa  Without masses or tenderness  Anus: Normal   Assessment/Plan:  48 y.o. female for annual exam   1. Encounter for gynecological examination with Papanicolaou smear of cervix Normal gynecologic exam.  Pap reflex done.  Breast exam normal.  Last Mammo 01/2018 negative.  Fasting health labs with Fam MD. -  Pap IG w/ reflex to HPV when ASC-U  2. S/P tubal ligation  3. Overweight (BMI 25.0-29.9) Recommend a lower calorie/carb diet such as [employer].  Aerobic physical activity 5 times a week and weight lifting every 2 days.  Princess Bruins MD, 9:31 AM 06/09/2018

## 2018-06-10 ENCOUNTER — Encounter: Payer: Self-pay | Admitting: Obstetrics & Gynecology

## 2018-06-10 NOTE — Patient Instructions (Signed)
1. Encounter for gynecological examination with Papanicolaou smear of cervix Normal gynecologic exam.  Pap reflex done.  Breast exam normal.  Last Mammo 01/2018 negative.  Fasting health labs with Fam MD. - Pap IG w/ reflex to HPV when ASC-U  2. S/P tubal ligation  3. Overweight (BMI 25.0-29.9) Recommend a lower calorie/carb diet such as [employer].  Aerobic physical activity 5 times a week and weight lifting every 2 days.  Anna Hernandez, it was a pleasure seeing you today!  I will inform you of your results as soon as they are available.

## 2018-06-14 LAB — PAP IG W/ RFLX HPV ASCU

## 2018-06-14 LAB — HUMAN PAPILLOMAVIRUS, HIGH RISK: HPV DNA High Risk: NOT DETECTED

## 2018-06-21 ENCOUNTER — Encounter: Payer: Self-pay | Admitting: *Deleted

## 2018-06-22 NOTE — Progress Notes (Signed)
Complete Physical  Assessment and Plan:    Routine general medical examination at a health care facility  Malignant melanoma of upper extremity, including shoulder, unspecified laterality ([place]) Continue follow up derm   Medication management - CBC with Differential/Platelet - BASIC METABOLIC PANEL WITH GFR - Hepatic function panel - Magnesium   Screening cholesterol level - Lipid panel   Screening for blood or protein in urine - Urinalysis, Routine w reflex microscopic (not at [facility]) - Microalbumin / creatinine urine ratio  Anemia, unspecified type -     TSH -     Iron,Total/Total Iron Binding Cap -     Vitamin B12 -     B Complex-C-Folic Acid (B COMPLEX-VITAMIN C-FOLIC ACID) 1 MG tablet; Take 1 tablet by mouth daily with breakfast.  Urinary frequency -     Urine Culture -     CT Abdomen Pelvis Wo Contrast; Future  Screening for diabetes mellitus -     Hemoglobin A1c  Right lower quadrant abdominal pain -     CT Abdomen Pelvis Wo Contrast; Future Rule out kidney stone with back pain, urinary symptoms,     Discussed med's effects and SE's. Screening labs and tests as requested with regular follow-up as recommended. Over 40 minutes of exam, counseling, chart review, and complex, high level critical decision making was performed this visit.  Future Appointments  Date Time Provider [place]  06/01/2019  2:00 PM Vicie Mutters, [place] [facility] None  06/28/2019 10:00 AM Vicie Mutters, PA-C [facility] None    HPI  48 y.o. female  presents for a complete physical.  She has history of recurrent UTI or the feeling of a UTI but the test are negative. She will occ have a right buttock pain. She states on and off she will feel that she has to urinate but will not have to urinate. She did pelvic floor PT did a few years ago.   Her blood pressure has been controlled at home, today their BP is BP: 122/86 She does workout, 30 mins workout, walking, yoga.  She denies  chest pain, shortness of breath, dizziness.  She has had some fatigue, B12 has helped, wants to start exercising again, states she sleeps well.  She follows with Derm, follows once a year, had melanoma removed left shoulder 2013.  She is not on cholesterol medication and denies myalgias. Her cholesterol is not at goal. The cholesterol last visit was:   Lab Results  Component Value Date   CHOL 186 04/14/2017   HDL 60 04/14/2017   LDLCALC 106 (H) 04/14/2017   TRIG 100 04/14/2017   CHOLHDL 3.1 04/14/2017    Last A1C in the office was:  Lab Results  Component Value Date   HGBA1C 5.3 03/24/2014   Patient is on Vitamin D supplement and fish oil combo.   Lab Results  Component Value Date   VD25OH 26 (L) 04/14/2017    Lab Results  Component Value Date   VITAMINB12 352 04/14/2017   BMI is Body mass index is 28.8 kg/m., she is working on diet and exercise. Wt Readings from Last 3 Encounters:  06/23/18 162 lb 9.6 oz (73.8 kg)  06/09/18 160 lb (72.6 kg)  02/03/18 164 lb (74.4 kg)   .   Current Medications:  Current Outpatient Medications on File Prior to Visit  Medication Sig Dispense Refill  . Cholecalciferol (VITAMIN D PO) Take 1 tablet daily by mouth.    . Cyanocobalamin (VITAMIN B-12) 1000 MCG SUBL Place under  the tongue daily.    Marland Kitchen Fexofenadine HCl (ALLEGRA PO) Take by mouth.      . fluticasone (FLONASE) 50 MCG/ACT nasal spray Place 2 sprays into both nostrils daily. 16 g 3   No current facility-administered medications on file prior to visit.    Health Maintenance:   Immunization History  Administered Date(s) Administered  . Influenza Split 02/06/2015  . Influenza-Unspecified 01/05/2014  . PPD Test 03/21/2013, 03/24/2014  . Td 04/08/2003  . Tdap 03/24/2014   Tetanus: 2015 Pneumovax: N/A Prevnar 13:  N/A Flu vaccine: 2018  Zostavax: N/A  LMP: Patient's last menstrual period was 05/26/2018. Pap: 06/2018 every 1 years PAP/neg HPV, ASCUS  MGM: 01/2018 DEXA:  N/A Colonoscopy:N/A EGD: N/A  Last Dental Exam: Dr. [name] Last Eye Exam: [facility], contacts [place] VISIT Patient Care Team: [name], MD as PCP - General (Internal Medicine) [name], FNP as Nurse Practitioner (Obstetrics and Gynecology) [name], MD as Consulting Physician (Dermatology) [name], MD as Referring Physician (Allergy and Immunology)  Medical History:  Past Medical History:  Diagnosis Date  . Allergy   . B12 deficiency anemia    Allergies No Known Allergies  SURGICAL HISTORY She  has a past surgical history that includes Cesarean section; Tubal ligation; and Melanoma excised.   FAMILY HISTORY Her family history includes Cancer in her maternal grandmother, paternal grandfather, and paternal grandmother; Diabetes in her father; Heart disease in her maternal grandfather; Hypertension in her father and mother; Stroke in her mother.   SOCIAL HISTORY She  reports that she quit smoking about 20 years ago. She has never used smokeless tobacco. She reports current alcohol use. She reports that she does not use drugs.   Review of Systems: Review of Systems  Constitutional: Negative for chills, diaphoresis, fever, malaise/fatigue and weight loss.  HENT: Negative.   Eyes: Negative.   Respiratory: Negative.  Negative for cough and shortness of breath.   Cardiovascular: Negative for chest pain, palpitations, claudication, leg swelling and PND.  Gastrointestinal: Negative.   Genitourinary: Negative.   Musculoskeletal: Negative.   Skin: Negative.   Neurological: Negative.  Negative for weakness.  Endo/Heme/Allergies: Negative.   Psychiatric/Behavioral: Negative.     Physical Exam: Estimated body mass index is 28.8 kg/m as calculated from the following:   Height as of this encounter: 5\' 3"  (1.6 m).   Weight as of this encounter: 162 lb 9.6 oz (73.8 kg). BP 122/86   Temp (!) 97.4 F (36.3 C)   Ht 5\' 3"  (1.6 m)   Wt 162 lb 9.6 oz  (73.8 kg)   LMP 05/26/2018   BMI 28.80 kg/m  General Appearance: Well nourished, in no apparent distress.  Eyes: PERRLA, EOMs, conjunctiva no swelling or erythema, normal fundi and vessels.  Sinuses: No Frontal/maxillary tenderness  ENT/Mouth: Ext aud canals clear, normal light reflex with TMs without erythema, bulging. Good dentition. No erythema, swelling, or exudate on post pharynx. Tonsils not swollen or erythematous. Hearing normal.  Neck: Supple, thyroid normal. No bruits  Respiratory: Respiratory effort normal, BS equal bilaterally without rales, rhonchi, wheezing or stridor.  Cardio: RRR without murmurs, rubs or gallops. Brisk peripheral pulses without edema.  Chest: symmetric, with normal excursions and percussion.  Breasts: defer Abdomen: Soft, nontender, no guarding, rebound, hernias, masses, or organomegaly.  Lymphatics: Non tender without lymphadenopathy.  Genitourinary: defer Musculoskeletal: Full ROM all peripheral extremities,5/5 strength, and normal gait.  Skin: Warm, dry without rashes, lesions, ecchymosis. Neuro: Cranial nerves intact, reflexes equal bilaterally.  Normal muscle tone, no cerebellar symptoms. Sensation intact.  Psych: Awake and oriented X 3, normal affect, Insight and Judgment appropriate.   EKG: defer AORTA SCAN: defer  Vicie Mutters 11:41 AM [facility]

## 2018-06-23 ENCOUNTER — Other Ambulatory Visit: Payer: Self-pay

## 2018-06-23 ENCOUNTER — Encounter: Payer: Self-pay | Admitting: Physician Assistant

## 2018-06-23 ENCOUNTER — Ambulatory Visit (INDEPENDENT_AMBULATORY_CARE_PROVIDER_SITE_OTHER): Payer: 59 | Admitting: Physician Assistant

## 2018-06-23 VITALS — BP 122/86 | Temp 97.4°F | Ht 63.0 in | Wt 162.6 lb

## 2018-06-23 DIAGNOSIS — Z0000 Encounter for general adult medical examination without abnormal findings: Secondary | ICD-10-CM | POA: Diagnosis not present

## 2018-06-23 DIAGNOSIS — D649 Anemia, unspecified: Secondary | ICD-10-CM

## 2018-06-23 DIAGNOSIS — R1031 Right lower quadrant pain: Secondary | ICD-10-CM

## 2018-06-23 DIAGNOSIS — Z79899 Other long term (current) drug therapy: Secondary | ICD-10-CM | POA: Diagnosis not present

## 2018-06-23 DIAGNOSIS — R82998 Other abnormal findings in urine: Secondary | ICD-10-CM

## 2018-06-23 DIAGNOSIS — J309 Allergic rhinitis, unspecified: Secondary | ICD-10-CM

## 2018-06-23 DIAGNOSIS — E559 Vitamin D deficiency, unspecified: Secondary | ICD-10-CM

## 2018-06-23 DIAGNOSIS — C4360 Malignant melanoma of unspecified upper limb, including shoulder: Secondary | ICD-10-CM

## 2018-06-23 DIAGNOSIS — Z1389 Encounter for screening for other disorder: Secondary | ICD-10-CM

## 2018-06-23 DIAGNOSIS — Z136 Encounter for screening for cardiovascular disorders: Secondary | ICD-10-CM

## 2018-06-23 DIAGNOSIS — Z131 Encounter for screening for diabetes mellitus: Secondary | ICD-10-CM

## 2018-06-23 DIAGNOSIS — Z13220 Encounter for screening for lipoid disorders: Secondary | ICD-10-CM

## 2018-06-23 DIAGNOSIS — Z0001 Encounter for general adult medical examination with abnormal findings: Secondary | ICD-10-CM

## 2018-06-23 DIAGNOSIS — R350 Frequency of micturition: Secondary | ICD-10-CM

## 2018-06-23 MED ORDER — NEPHRO-VITE RX 1 MG PO TABS: 1.0000 | ORAL_TABLET | Freq: Every day | ORAL | 4 refills | Status: DC

## 2018-06-23 NOTE — Patient Instructions (Addendum)
Premarin do 1/2 a tube (2 grams) every day for 2 weeks and see if this helps- HOLD OFF ON THIS UNTIL AFTER THE CT SCAN Check out below, do diary and diet changes Will get CT scan to rule out a kidney stone If not better will refer to urology  VITAMIN D IS IMPORTANT  Vitamin D goal is between 60-80  Please make sure that you are taking your Vitamin D as directed.   It is very important as a natural anti-inflammatory   helping hair, skin, and nails, as well as reducing stroke and heart attack risk.   It helps your bones and helps with mood.  We want you on at least 5000 IU daily  It also decreases numerous cancer risks so please take it as directed.   Low Vit D is associated with a 200-300% higher risk for CANCER   and 200-300% higher risk for HEART   ATTACK  &  STROKE.    .....................................Marland Kitchen  It is also associated with higher death rate at younger ages,   autoimmune diseases like Rheumatoid arthritis, Lupus, Multiple Sclerosis.     Also many other serious conditions, like depression, Alzheimer's  Dementia, infertility, muscle aches, fatigue, fibromyalgia - just to name a few.  +++++++++++++++++++  Can get liquid vitamin D from [place] here in [place] at  [facility] alternatives [address] [place], [place] [address] Or you can try earth fare   B complex get on it Can add folic acid 1 gram  Interstitial Cystitis  Interstitial cystitis is inflammation of the bladder. This may cause pain in the bladder area as well as a frequent and urgent need to urinate. The bladder is a hollow organ in the lower part of the abdomen. It stores urine after the urine is made in the kidneys. The severity of interstitial cystitis can vary from person to person. You may have flare-ups, and then your symptoms may go away for a while. For many people, it becomes a long-term (chronic) problem. What are the causes? The cause of this condition is not known. What increases  the risk? The following factors may make you more likely to develop this condition:  You are female.  You have fibromyalgia.  You have irritable bowel syndrome (IBS).  You have endometriosis. This condition may be aggravated by:  Stress.  Smoking.  Spicy foods. What are the signs or symptoms? Symptoms of interstitial cystitis vary, and they can change over time. Symptoms may include:  Discomfort or pain in the bladder area, which is in the lower abdomen. Pain can range from mild to severe. The pain may change in intensity as the bladder fills with urine or as it empties.  Pain in the pelvic area, between the hip bones.  An urgent need to urinate.  Frequent urination.  Pain during urination.  Pain during sex.  Blood in the urine. For women, symptoms often get worse during menstruation. How is this diagnosed? This condition is diagnosed based on your symptoms, your medical history, and a physical exam. You may have tests to rule out other conditions, such as:  Urine tests.  Cystoscopy. For this test, a tool similar to a very thin telescope is used to look into your bladder.  Biopsy. This involves taking a sample of tissue from the bladder to be examined under a microscope. How is this treated? There is no cure for this condition, but treatment can help you control your symptoms. Work closely with your health care  provider to find the most effective treatments for you. Treatment options may include:  Medicines to relieve pain and reduce how often you feel the need to urinate.  Learning ways to control when you urinate (bladder training).  Lifestyle changes, such as changing your diet or taking steps to control stress.  Using a device that provides electrical stimulation to your nerves, which can relieve pain (neuromodulation therapy). The device is placed on your back, where it blocks the nerves that cause you to feel pain in your bladder area.  A procedure that  stretches your bladder by filling it with air or fluid.  Surgery. This is rare. It is only done for extreme cases, if other treatments do not help. Follow these instructions at home: Bladder training   Use bladder training techniques as directed. Techniques may include: ? Urinating at scheduled times. ? Training yourself to delay urination. ? Doing exercises (Kegel exercises) to strengthen the muscles that control urine flow.  Keep a bladder diary. ? Write down the times that you urinate and any symptoms that you have. This can help you find out which foods, liquids, or activities make your symptoms worse. ? Use your bladder diary to schedule bathroom trips. If you are away from home, plan to be near a bathroom at each of your scheduled times.  Make sure that you urinate just before you leave the house and just before you go to bed. Eating and drinking  Make dietary changes as recommended by your health care provider. You may need to avoid: ? Spicy foods. ? Foods that contain a lot of potassium.  Limit your intake of beverages that make you need to urinate. These include: ? Caffeinated beverages like soda, coffee, and tea. ? Alcohol. General instructions  Take over-the-counter and prescription medicines only as told by your health care provider.  Do not drink alcohol.  You can try a warm or cool compress over your bladder for comfort.  Avoid wearing tight clothing.  Do not use any products that contain nicotine or tobacco, such as cigarettes and e-cigarettes. If you need help quitting, ask your health care provider.  Keep all follow-up visits as told by your health care provider. This is important. Contact a health care provider if you have:  Symptoms that do not get better with treatment.  Pain or discomfort that gets worse.  More frequent urges to urinate.  A fever. Get help right away if:  You have no control over when you urinate. Summary  Interstitial  cystitis is inflammation of the bladder.  This condition may cause pain in the bladder area as well as a frequent and urgent need to urinate.  You may have flare-ups of the condition, and then it may go away for a while. For many people, it becomes a long-term (chronic) problem.  There is no cure for interstitial cystitis, but treatment methods are available to control your symptoms. This information is not intended to replace advice given to you by your health care provider. Make sure you discuss any questions you have with your health care provider. Document Released: 11/23/2003 Document Revised: 02/16/2017 Document Reviewed: 02/16/2017 Elsevier Interactive Patient Education  2019 [place] for Interstitial Cystitis Interstitial cystitis (IC) is a long-term (chronic) condition that causes pain and pressure in the bladder, the lower abdomen, and the pelvic area. Other symptoms of IC include urinary urgency and frequency. Symptoms tend to come and go. Many people with IC find that certain foods  trigger their symptoms. Different foods may be problematic for different people. Some foods are more likely to cause symptoms than others. Learning which foods bother you and which do not can help you come up with an eating plan to manage IC. What are tips for following this plan? You may find it helpful to work with a dietitian. This health care provider can help you develop your eating plan by doing an elimination diet, which involves these steps:  Start with a list of foods that you think trigger your IC symptoms along with the foods that most commonly trigger symptoms for many people with IC.  Eliminate those foods from your diet for about one month, then start reintroducing the foods one at a time to see which ones trigger your symptoms.  Make a list of the foods that trigger your symptoms. It may take several months to find out which foods bother you. Reading food labels Once  you know which foods trigger your IC symptoms, you can avoid them. However, it is also a good idea to read food labels because some foods that trigger your symptoms may be included as ingredients in other foods. These ingredients may include:  Chili peppers.  Tomato products.  Soy.  Worcestershire sauce.  Vinegar.  Alcohol.  Citrus flavors or juices.  Artificial sweeteners.  Monosodium glutamate. Shopping  Shopping can be a challenge if many foods trigger your IC. When you go grocery shopping, bring a list of the foods you can eat.  You can get an app for your phone that lets you know which foods are the safest and which you may want to avoid. You can find the app at the Interstitial Cystitis Network website: www.ic-network.com Meal planning  Plan your meals according to the results of your elimination diet. If you have not done an elimination diet, plan meals according to IC food lists recommended by your health care provider or dietitian. These lists tell you which foods are least and most likely to cause symptoms.  Avoid certain types of food when you go out to eat, such as pizza and foods typically served at [place], [place], and [place]. These foods often contain ingredients that can aggravate IC. General information Here are some general guidelines for an IC eating plan:  Do not eat large portions.  Drink plenty of fluids with your meals.  Do not eat foods that are high in sugar, salt, or saturated fat.  Choose whole fruits instead of juice.  Eat a colorful variety of vegetables. What foods should I eat? For people with IC, the best diet is a balanced one that includes things from all the food groups. Even if you have to avoid certain foods, there are still plenty of healthy choices in each group. The following are some foods that are least bothersome and may be safest to eat: Fruits Bananas. Blueberries and blueberry juice. Melons. Pears. Apples. Dates.  Prunes. Raisins. Apricots. Vegetables Asparagus. Avocado. Celery. Beets. Bell peppers. Black olives. Broccoli. Brussels sprouts. Cabbage. Carrots. Cauliflower. Cucumber. Eggplant. Green beans. Potatoes. Radishes. Spinach. Squash. Turnips. Zucchini. Mushrooms. Peas. Grains Oats. Rice. Bran. Oatmeal. Whole wheat bread. Meats and other proteins Beef. Fish and other seafood. Eggs. Nuts. Peanut butter. Pork. Poultry. Lamb. Garbanzo beans. Pinto beans. Dairy Whole or low-fat milk. American, mozzarella, mild cheddar, feta, ricotta, and cream cheeses. The items listed above may not be a complete list of foods and beverages you can eat. Contact a dietitian for more information. What foods should I  avoid? You should avoid any foods that seem to trigger your symptoms. It is also a good idea to avoid foods that are most likely to cause symptoms in many people with IC. These include the following: Fruits Citrus fruits, including lemons, limes, oranges, and grapefruit. Cranberries. Strawberries. Pineapple. Kiwi. Vegetables Chili peppers. Onions. Sauerkraut. Tomato and tomato products. Angie Fava. Grains You do not need to avoid any type of grain unless it triggers your symptoms. Meats and other proteins Precooked or cured meats, such as sausages or meat loaves. Soy products. Dairy Chocolate ice cream. Processed cheese. Yogurt. Beverages Alcohol. Chocolate drinks. Coffee. Cranberry juice. Carbonated drinks. Tea (black, green, or herbal). Tomato juice. Sports drinks. The items listed above may not be a complete list of foods and beverages you should avoid. Contact a dietitian for more information. Summary  Many people with IC find that certain foods trigger their symptoms. Different foods may be problematic for different people. Some foods are more likely to cause symptoms than others.  You may find it helpful to work with a dietitian to do an elimination diet and come up with an eating plan that is right  for you.  Plan your meals according to the results of your elimination diet. If you have not done an elimination diet, plan your meals using IC food lists. These lists tell you which foods are least and most likely to cause symptoms.  The best diet for people with IC is a balanced diet that includes foods from all the food groups. Even if you have to avoid certain foods, there are still plenty of healthy choices in each group. This information is not intended to replace advice given to you by your health care provider. Make sure you discuss any questions you have with your health care provider. Document Released: 11/26/2017 Document Revised: 11/26/2017 Document Reviewed: 11/26/2017 Elsevier Interactive Patient Education  2019 [employer].

## 2018-06-24 ENCOUNTER — Other Ambulatory Visit: Payer: Self-pay | Admitting: Physician Assistant

## 2018-06-24 DIAGNOSIS — D649 Anemia, unspecified: Secondary | ICD-10-CM

## 2018-06-24 LAB — CBC WITH DIFFERENTIAL/PLATELET
ABSOLUTE MONOCYTES: 436 {cells}/uL (ref 200–950)
BASOS ABS: 29 {cells}/uL (ref 0–200)
Basophils Relative: 0.6 %
Eosinophils Absolute: 118 cells/uL (ref 15–500)
Eosinophils Relative: 2.4 %
HCT: 30.8 % — ABNORMAL LOW (ref 35.0–45.0)
Hemoglobin: 9.3 g/dL — ABNORMAL LOW (ref 11.7–15.5)
Lymphs Abs: 1397 cells/uL (ref 850–3900)
MCH: 21.6 pg — ABNORMAL LOW (ref 27.0–33.0)
MCHC: 30.2 g/dL — AB (ref 32.0–36.0)
MCV: 71.6 fL — ABNORMAL LOW (ref 80.0–100.0)
MPV: 11.7 fL (ref 7.5–12.5)
Monocytes Relative: 8.9 %
Neutro Abs: 2920 cells/uL (ref 1500–7800)
Neutrophils Relative %: 59.6 %
PLATELETS: 358 10*3/uL (ref 140–400)
RBC: 4.3 10*6/uL (ref 3.80–5.10)
RDW: 17.7 % — AB (ref 11.0–15.0)
TOTAL LYMPHOCYTE: 28.5 %
WBC: 4.9 10*3/uL (ref 3.8–10.8)

## 2018-06-24 LAB — HEMOGLOBIN A1C
Hgb A1c MFr Bld: 5.6 % of total Hgb (ref <5.7)
Mean Plasma Glucose: 114 (calc)
eAG (mmol/L): 6.3 (calc)

## 2018-06-24 LAB — URINALYSIS, ROUTINE W REFLEX MICROSCOPIC
Bilirubin Urine: NEGATIVE
Glucose, UA: NEGATIVE
Hgb urine dipstick: NEGATIVE
Ketones, ur: NEGATIVE
Leukocytes,Ua: NEGATIVE
Nitrite: NEGATIVE
Protein, ur: NEGATIVE
Specific Gravity, Urine: 1.006 (ref 1.001–1.03)
pH: 5.5 (ref 5.0–8.0)

## 2018-06-24 LAB — COMPLETE METABOLIC PANEL WITH GFR
AG Ratio: 1.5 (calc) (ref 1.0–2.5)
ALT: 13 U/L (ref 6–29)
AST: 19 U/L (ref 10–35)
Albumin: 4.3 g/dL (ref 3.6–5.1)
Alkaline phosphatase (APISO): 71 U/L (ref 31–125)
BUN: 8 mg/dL (ref 7–25)
CO2: 25 mmol/L (ref 20–32)
Calcium: 9 mg/dL (ref 8.6–10.2)
Chloride: 107 mmol/L (ref 98–110)
Creat: 0.65 mg/dL (ref 0.50–1.10)
GFR, Est African American: 121 mL/min/{1.73_m2} (ref >=60)
GFR, Est Non African American: 104 mL/min/{1.73_m2} (ref >=60)
GLUCOSE: 88 mg/dL (ref 65–99)
Globulin: 2.8 g/dL (calc) (ref 1.9–3.7)
Potassium: 4.3 mmol/L (ref 3.5–5.3)
Sodium: 138 mmol/L (ref 135–146)
Total Bilirubin: 0.3 mg/dL (ref 0.2–1.2)
Total Protein: 7.1 g/dL (ref 6.1–8.1)

## 2018-06-24 LAB — MICROALBUMIN / CREATININE URINE RATIO
Creatinine, Urine: 38 mg/dL (ref 20–275)
MICROALB/CREAT RATIO: 5 ug/mg{creat} (ref <30)
Microalb, Ur: 0.2 mg/dL

## 2018-06-24 LAB — URINE CULTURE
MICRO NUMBER: 333839
SPECIMEN QUALITY:: ADEQUATE

## 2018-06-24 LAB — MAGNESIUM: Magnesium: 2 mg/dL (ref 1.5–2.5)

## 2018-06-24 LAB — LIPID PANEL
Cholesterol: 153 mg/dL (ref <200)
HDL: 46 mg/dL — ABNORMAL LOW (ref >=50)
LDL Cholesterol (Calc): 91 mg/dL (calc)
Non-HDL Cholesterol (Calc): 107 mg/dL (calc) (ref <130)
Total CHOL/HDL Ratio: 3.3 (calc) (ref <5.0)
Triglycerides: 73 mg/dL (ref <150)

## 2018-06-24 LAB — IRON, TOTAL/TOTAL IRON BINDING CAP
%SAT: 4 % (calc) — ABNORMAL LOW (ref 16–45)
Iron: 18 ug/dL — ABNORMAL LOW (ref 40–190)
TIBC: 482 mcg/dL (calc) — ABNORMAL HIGH (ref 250–450)

## 2018-06-24 LAB — TSH: TSH: 1.26 mIU/L

## 2018-06-24 LAB — VITAMIN B12: Vitamin B-12: 220 pg/mL (ref 200–1100)

## 2018-06-24 LAB — VITAMIN D 25 HYDROXY (VIT D DEFICIENCY, FRACTURES): VIT D 25 HYDROXY: 23 ng/mL — AB (ref 30–100)

## 2018-06-24 MED ORDER — INTEGRA 62.5-62.5-40-3 MG PO CAPS: ORAL_CAPSULE | ORAL | 3 refills | Status: DC

## 2018-07-21 ENCOUNTER — Encounter: Payer: 59 | Admitting: Obstetrics & Gynecology

## 2018-07-29 ENCOUNTER — Encounter: Payer: Self-pay | Admitting: Physician Assistant

## 2018-11-18 ENCOUNTER — Ambulatory Visit: Payer: 59 | Admitting: Adult Health

## 2018-11-18 ENCOUNTER — Encounter: Payer: Self-pay | Admitting: Adult Health

## 2018-11-18 ENCOUNTER — Other Ambulatory Visit: Payer: Self-pay

## 2018-11-18 VITALS — BP 106/64 | HR 80 | Temp 97.3°F | Wt 162.0 lb

## 2018-11-18 DIAGNOSIS — W57XXXA Bitten or stung by nonvenomous insect and other nonvenomous arthropods, initial encounter: Secondary | ICD-10-CM

## 2018-11-18 DIAGNOSIS — S90464A Insect bite (nonvenomous), right lesser toe(s), initial encounter: Secondary | ICD-10-CM

## 2018-11-18 NOTE — Progress Notes (Signed)
Assessment and Plan:  Anna Hernandez was seen today for toe pain.  Diagnoses and all orders for this visit:  Insect bite of lesser toe of right foot, initial encounter No significant concerning findings on exam; I suspect this is simple post-insect bite inflammation/subdermal thickening/hyperpigmentation, possibly head of tick or other insect may still remain though not obvious on exam Discussed if this is the case body will expel eventually No symptoms suggesting need for lymes testing Not suggestive of any local skin infection requiring abx She is reassured by my exam today, will monitor at home for resolution, will follow up if any new symptoms or worsening Suggested she try OTC arthritis creams topically or capsaicin cream for distraction from vague sensation, could try OTC NSAIDs if particularly bothersome   Further disposition pending results of labs. Discussed med's effects and SE's.   Over 15 minutes of exam, counseling, chart review, and critical decision making was performed.   Future Appointments  Date Time Provider [place]  06/01/2019  2:00 PM Vicie Mutters, PA-C [facility] None  06/28/2019 10:00 AM Vicie Mutters, PA-C [facility] None    ------------------------------------------------------------------------------------------------------------------   HPI BP 106/64   Pulse 80   Temp (!) 97.3 F (36.3 C)   Wt 162 lb (73.5 kg)   SpO2 97%   BMI 28.70 kg/m   48 y.o.female presents for evaluation of discomfort between R 3rd and 4th toe intermittently for ~2 months. She reports removed an insect, ? Tick but unsure, applied a cotton ball and black "dot" came off. She reports since that time there is a mild distracting sensation, denies pain or itching, mainly notes when she is at rest, presents for evaluation as unsure why not resolving.   Hasn't tried any interventions.  Denies any rash, fever/chills, fatigue, HA, myalgias/arthralgias, joint stiffness, tender  nodules.    Past Medical History:  Diagnosis Date  . Allergy   . B12 deficiency anemia      No Known Allergies  Current Outpatient Medications on File Prior to Visit  Medication Sig  . B Complex-C-Folic Acid (B COMPLEX-VITAMIN C-FOLIC ACID) 1 MG tablet Take 1 tablet by mouth daily with breakfast.  . Cholecalciferol (VITAMIN D PO) Take 1 tablet daily by mouth.  . Fe Fum-FePoly-Vit C-Vit B3 (INTEGRA) 62.5-62.5-40-3 MG CAPS One pill daily PO for anemia  . Fexofenadine HCl (ALLEGRA PO) Take by mouth as needed.   . fluticasone (FLONASE) 50 MCG/ACT nasal spray Place 2 sprays into both nostrils daily.  . Cyanocobalamin (VITAMIN B-12) 1000 MCG SUBL Place under the tongue daily.   No current facility-administered medications on file prior to visit.     ROS: all negative except above.   Physical Exam:  BP 106/64   Pulse 80   Temp (!) 97.3 F (36.3 C)   Wt 162 lb (73.5 kg)   SpO2 97%   BMI 28.70 kg/m   General Appearance: Well nourished, in no apparent distress. Eyes: PERRLA, conjunctiva no swelling or erythema ENT/Mouth: No erythema, swelling, or exudate on post pharynx.  Tonsils not swollen or erythematous. Hearing normal.  Neck: Supple, thyroid normal.  Respiratory: Respiratory effort normal, BS equal bilaterally without rales, rhonchi, wheezing or stridor.  Cardio: RRR with no MRGs. Brisk peripheral pulses without edema.  Abdomen: Soft, + BS.  Non tender. Lymphatics: Non tender without lymphadenopathy.  Musculoskeletal: Full ROM, 5/5 strength, normal gait. No joint effusion or tenderness through R foot.  Skin: Warm, dry without rashes, lesions, ecchymosis. She has small pink area between R  foot 3rd and 4th digits; subtle subdermal thickening without visible foreign body, non-tender.   Neuro: Cranial nerves intact. Normal muscle tone, Sensation intact.  Psych: Awake and oriented X 3, normal affect, Insight and Judgment appropriate.     Izora Ribas, NP 11:41  AM [facility]

## 2018-11-18 NOTE — Patient Instructions (Signed)
Tick Bite Information, Adult  Ticks are insects that can bite. Most ticks live in shrubs and grassy areas. They climb onto people and animals that go by. Then they bite. Some ticks carry germs that can make you sick. How can I prevent tick bites?  Use an insect repellent that has 20% or higher of the ingredients DEET, picaridin, or IR3535. Put this insect repellent on: ? Bare skin. ? The tops of your boots. ? Your pant legs. ? The ends of your sleeves.  If you use an insect repellent that has the ingredient permethrin, make sure to follow the instructions on the bottle. Treat the following: ? Clothing. ? Supplies. ? Boots. ? Tents.  Wear long sleeves, long pants, and light colors.  Tuck your pant legs into your socks.  Stay in the middle of the trail.  Try not to walk through long grass.  Before going inside your house, check your clothes, hair, and skin for ticks. Make sure to check your head, neck, armpits, waist, groin, and joint areas.  Check for ticks every day.  When you come indoors: ? Wash your clothes right away. ? Shower right away. ? Dry your clothes in a dryer on high heat for 60 minutes or more. What is the right way to remove a tick? Remove a tick from your skin as soon as possible.  To remove a tick that is crawling on your skin: ? Go outdoors and brush the tick off. ? Use tape or a lint roller.  To remove a tick that is biting: ? Wash your hands. ? If you have latex gloves, put them on. ? Use tweezers, curved forceps, or a tick-removal tool to grasp the tick. Grasp the tick as close to your skin and as close to the tick's head as possible. ? Gently pull up until the tick lets go.  Try to keep the tick's head attached to its body.  Do not twist or jerk the tick.  Do not squeeze or crush the tick. Do not try to remove a tick with heat, alcohol, petroleum jelly, or fingernail polish. How should I get rid of a tick? Here are some ways to get  rid of a tick that is alive:  Place the tick in rubbing alcohol.  Place the tick in a bag or container you can close tightly.  Wrap the tick tightly in tape.  Flush the tick down the toilet. Contact a doctor if:  You have symptoms of a disease, such as: ? Pain in a muscle, joint, or bone. ? Trouble walking or moving your legs. ? Numbness in your legs. ? Inability to move (paralysis). ? A red rash that makes a circle (bull's-eye rash). ? Redness and swelling where the tick bit you. ? A fever. ? Throwing up (vomiting) over and over. ? Diarrhea. ? Weight loss. ? Tender and swollen lymph glands. ? Shortness of breath. ? Cough. ? Belly pain (abdominal pain). ? Headache. ? Being more tired than normal. ? A change in how alert (conscious) you are. ? Confusion. Get help right away if:  You cannot remove a tick.  A part of a tick breaks off and gets stuck in your skin.  You are feeling worse. Summary  Ticks may carry germs that can make you sick.  To prevent tick bites, wear long sleeves, long pants, and light colors. Use insect repellent. Follow the instructions on the bottle.  If the tick is biting, do not  try to remove it with heat, alcohol, petroleum jelly, or fingernail polish.  Use tweezers, curved forceps, or a tick-removal tool to grasp the tick. Gently pull up until the tick lets go. Do not twist or jerk the tick. Do not squeeze or crush the tick.  If you have symptoms, contact a doctor. This information is not intended to replace advice given to you by your health care provider. Make sure you discuss any questions you have with your health care provider. Document Released: 06/18/2009 Document Revised: 03/08/2018 Document Reviewed: 07/04/2016 Elsevier Patient Education  [place].        Lyme Disease Lyme disease is an infection that can affect many parts of the body, including the skin, joints, and nervous system. It is a bacterial infection that  starts from the bite of an infected tick. Over time, the infection can worsen, and some of the symptoms are similar to the flu. If Lyme disease is not treated, it may cause joint pain, swelling, numbness, problems thinking, fatigue, muscle weakness, and other problems. What are the causes? This condition is caused by bacteria called Borrelia burgdorferi.  You can get Lyme disease by being bitten by an infected tick.  Only black-legged, or Ixodes, ticks that are infected with the bacteria can cause Lyme disease.  The tick must be attached to your skin for a certain period of time to pass along the infection. This is usually 36-48 hours.  Deer often carry infected ticks. What increases the risk? The following factors may make you more likely to develop this condition:  Living in or visiting these areas in the [place] ? [place]. ? The [place]. ? [place].  Spending time in wooded or grassy areas.  Being outdoors with exposed skin.  Camping, gardening, hiking, fishing, hunting, or working outdoors.  Failing to remove a tick from your skin. What are the signs or symptoms? Symptoms of this condition may include:  Chills and fever.  Headache.  Fatigue.  General achiness.  Muscle pain.  Joint pain, often in the knees.  A round, red rash that surrounds the center of the tick bite. The center of the rash may be blood colored or have tiny blisters.  Swollen lymph glands.  Stiff neck. How is this diagnosed? This condition is diagnosed based on:  Your symptoms and medical history.  A physical exam.  A blood test. How is this treated? The main treatment for this condition is antibiotic medicine, which is usually taken by mouth (orally).  The length of treatment depends on how soon after a tick bite you begin taking the medicine. In some cases, treatment is necessary for several weeks.  If the infection is severe, antibiotics may need to be given  through an IV that is inserted into one of your veins. Follow these instructions at home:  Take over-the-counter and prescription medicines only as told by your health care provider. Finish all antibiotic medicine, even when you start to feel better.  Ask your health care provider about taking a probiotic in between doses of your antibiotic to help avoid an upset stomach or diarrhea.  Check with your health care provider before supplementing your treatment. Many alternative therapies have not been proven and may be harmful to you.  Keep all follow-up visits as told by your health care provider. This is important. How is this prevented? You can become reinfected if you get another tick bite from an infected tick. Take these steps to help  prevent an infection:  Cover your skin with light-colored clothing when you are outdoors in the spring and summer months.  Spray clothing and skin with bug spray. The spray should be 20-30% DEET. You can also treat clothing with permethrin, and let it dry before you wear it. Do not apply permethrin directly to your skin. Permethrin can also be used to treat camping gear and boots. Always read and follow the instructions that come with a bug spray or insecticide.  Avoid wooded, grassy, and shaded areas.  Remove yard litter, brush, trash, and plants that attract deer and rodents.  Check yourself for ticks when you come indoors.  Wash clothing worn each day.  Shower after spending time outdoors.  Check your pets for ticks before they come inside.  If you find a tick attached to your skin: ? Remove it with tweezers. ? Clean your hands and the bite area with rubbing alcohol or soap and water. ? Dispose of the tick by putting it in rubbing alcohol, putting it in a sealed bag or container, or flushing it down the toilet. ? You may choose to save the tick in a sealed container if you wish for it to be tested at a later time. Pregnant women should take  special care to avoid tick bites because it is possible that the infection may be passed along to the fetus. Contact a health care provider if:  You have symptoms after treatment.  You have removed a tick and want to bring it to your health care provider for testing. Get help right away if:  You have an irregular heartbeat.  You have chest pain.  You have nerve pain.  Your face feels numb.  You develop the following: ? A stiff neck. ? A severe headache. ? Severe nausea and vomiting. ? Sensitivity to light. Summary  Lyme disease is an infection that can affect many parts of the body, including the skin, joints, and nervous system.  This condition is caused by bacteria called Borrelia burgdorferi.  You can get Lyme disease by being bitten by an infected tick.  The main treatment for this condition is antibiotic medicine. This information is not intended to replace advice given to you by your health care provider. Make sure you discuss any questions you have with your health care provider. Document Released: 06/30/2000 Document Revised: 07/16/2018 Document Reviewed: 06/10/2018 Elsevier Patient Education  2020 [employer].

## 2019-01-24 LAB — HM MAMMOGRAPHY

## 2019-01-26 ENCOUNTER — Encounter: Payer: Self-pay | Admitting: *Deleted

## 2019-04-08 HISTORY — PX: COLONOSCOPY W/ POLYPECTOMY: SHX1380

## 2019-06-01 ENCOUNTER — Encounter: Payer: Self-pay | Admitting: Physician Assistant

## 2019-06-06 ENCOUNTER — Telehealth: Payer: Self-pay | Admitting: Physician Assistant

## 2019-06-06 ENCOUNTER — Encounter: Payer: Self-pay | Admitting: Gastroenterology

## 2019-06-06 DIAGNOSIS — Z1211 Encounter for screening for malignant neoplasm of colon: Secondary | ICD-10-CM

## 2019-06-06 NOTE — Addendum Note (Signed)
Addended by: Vicie Mutters R on: 06/06/2019 12:56 PM   Modules accepted: Orders

## 2019-06-06 NOTE — Telephone Encounter (Signed)
patient called to request referral to GI, previous referral to GI never scheduled. Please advise your recommendation.

## 2019-06-28 ENCOUNTER — Encounter: Payer: 59 | Admitting: Physician Assistant

## 2019-06-28 ENCOUNTER — Encounter: Payer: 59 | Admitting: Adult Health Nurse Practitioner

## 2019-06-29 ENCOUNTER — Other Ambulatory Visit: Payer: Self-pay

## 2019-06-29 ENCOUNTER — Ambulatory Visit (AMBULATORY_SURGERY_CENTER): Payer: Self-pay | Admitting: *Deleted

## 2019-06-29 VITALS — Temp 96.9°F | Ht 63.0 in | Wt 168.0 lb

## 2019-06-29 DIAGNOSIS — Z01818 Encounter for other preprocedural examination: Secondary | ICD-10-CM

## 2019-06-29 DIAGNOSIS — Z1211 Encounter for screening for malignant neoplasm of colon: Secondary | ICD-10-CM

## 2019-06-29 MED ORDER — PEG 3350-KCL-NA BICARB-NACL 420 G PO SOLR: 4000.0000 mL | Freq: Once | ORAL | 0 refills | Status: AC

## 2019-06-29 NOTE — Progress Notes (Signed)
No egg or soy allergy known to patient   issues with past sedation with any surgeries  or procedures- PONV , no intubation problems  No diet pills per patient No home 02 use per patient  No blood thinners per patient  Pt denies issues with constipation  No A fib or A flutter  EMMI video sent to pt's e mail   Due to the COVID-19 pandemic we are asking patients to follow these guidelines. Please only bring one care partner. Please be aware that your care partner may wait in the car in the parking lot or if they feel like they will be too hot to wait in the car, they may wait in the lobby on the 4th floor. All care partners are required to wear a mask the entire time (we do not have any that we can provide them), they need to practice social distancing, and we will do a Covid check for all patient's and care partners when you arrive. Also we will check their temperature and your temperature. If the care partner waits in their car they need to stay in the parking lot the entire time and we will call them on their cell phone when the patient is ready for discharge so they can bring the car to the front of the building. Also all patient's will need to wear a mask into building.

## 2019-07-11 ENCOUNTER — Other Ambulatory Visit: Payer: Self-pay | Admitting: Gastroenterology

## 2019-07-11 ENCOUNTER — Ambulatory Visit (INDEPENDENT_AMBULATORY_CARE_PROVIDER_SITE_OTHER): Payer: 59

## 2019-07-11 DIAGNOSIS — Z1159 Encounter for screening for other viral diseases: Secondary | ICD-10-CM

## 2019-07-12 LAB — SARS CORONAVIRUS 2 (TAT 6-24 HRS): SARS Coronavirus 2: NEGATIVE

## 2019-07-13 ENCOUNTER — Encounter: Payer: Self-pay | Admitting: Gastroenterology

## 2019-07-13 ENCOUNTER — Ambulatory Visit (AMBULATORY_SURGERY_CENTER): Payer: 59 | Admitting: Gastroenterology

## 2019-07-13 ENCOUNTER — Other Ambulatory Visit: Payer: Self-pay

## 2019-07-13 ENCOUNTER — Telehealth: Payer: Self-pay

## 2019-07-13 VITALS — BP 118/71 | HR 99 | Temp 97.1°F | Resp 16 | Ht 63.0 in | Wt 168.0 lb

## 2019-07-13 DIAGNOSIS — K635 Polyp of colon: Secondary | ICD-10-CM | POA: Diagnosis not present

## 2019-07-13 DIAGNOSIS — Z1211 Encounter for screening for malignant neoplasm of colon: Secondary | ICD-10-CM

## 2019-07-13 DIAGNOSIS — D123 Benign neoplasm of transverse colon: Secondary | ICD-10-CM | POA: Diagnosis not present

## 2019-07-13 DIAGNOSIS — Z8371 Family history of colonic polyps: Secondary | ICD-10-CM | POA: Diagnosis not present

## 2019-07-13 MED ORDER — SODIUM CHLORIDE 0.9 % IV SOLN: 500.0000 mL | INTRAVENOUS | Status: DC

## 2019-07-13 NOTE — Progress Notes (Signed)
Called to room to assist during endoscopic procedure.  Patient ID and intended procedure confirmed with present staff. Received instructions for my participation in the procedure from the performing physician.  

## 2019-07-13 NOTE — Telephone Encounter (Signed)
Referral faxed to [facility] 

## 2019-07-13 NOTE — Progress Notes (Signed)
Report given to PACU, vss 

## 2019-07-13 NOTE — Patient Instructions (Signed)
Handouts given for high fiber diet, hemorrhoids, diverticulosis and polyps.  YOU HAD AN ENDOSCOPIC PROCEDURE TODAY AT [facility]:   Refer to the procedure report that was given to you for any specific questions about what was found during the examination.  If the procedure report does not answer your questions, please call your gastroenterologist to clarify.  If you requested that your care partner not be given the details of your procedure findings, then the procedure report has been included in a sealed envelope for you to review at your convenience later.  YOU SHOULD EXPECT: Some feelings of bloating in the abdomen. Passage of more gas than usual.  Walking can help get rid of the air that was put into your GI tract during the procedure and reduce the bloating. If you had a lower endoscopy (such as a colonoscopy or flexible sigmoidoscopy) you may notice spotting of blood in your stool or on the toilet paper. If you underwent a bowel prep for your procedure, you may not have a normal bowel movement for a few days.  Please Note:  You might notice some irritation and congestion in your nose or some drainage.  This is from the oxygen used during your procedure.  There is no need for concern and it should clear up in a day or so.  SYMPTOMS TO REPORT IMMEDIATELY:   Following lower endoscopy (colonoscopy or flexible sigmoidoscopy):  Excessive amounts of blood in the stool  Significant tenderness or worsening of abdominal pains  Swelling of the abdomen that is new, acute  Fever of 100F or higher  For urgent or emergent issues, a gastroenterologist can be reached at any hour by calling [phone]. Do not use MyChart messaging for urgent concerns.    DIET:  We do recommend a small meal at first, but then you may proceed to your regular diet.  Drink plenty of fluids but you should avoid alcoholic beverages for 24 hours.  ACTIVITY:  You should plan to take it easy for the rest of  today and you should NOT DRIVE or use heavy machinery until tomorrow (because of the sedation medicines used during the test).    FOLLOW UP: Our staff will call the number listed on your records 48-72 hours following your procedure to check on you and address any questions or concerns that you may have regarding the information given to you following your procedure. If we do not reach you, we will leave a message.  We will attempt to reach you two times.  During this call, we will ask if you have developed any symptoms of COVID 19. If you develop any symptoms (ie: fever, flu-like symptoms, shortness of breath, cough etc.) before then, please call [phone].  If you test positive for Covid 19 in the 2 weeks post procedure, please call and report this information to [place].    If any biopsies were taken you will be contacted by phone or by letter within the next 1-3 weeks.  Please call us at [phone] if you have not heard about the biopsies in 3 weeks.    SIGNATURES/CONFIDENTIALITY: You and/or your care partner have signed paperwork which will be entered into your electronic medical record.  These signatures attest to the fact that that the information above on your After Visit Summary has been reviewed and is understood.  Full responsibility of the confidentiality of this discharge information lies with you and/or your care-partner.

## 2019-07-13 NOTE — Op Note (Signed)
[place] Patient Name: Anna Hernandez Procedure Date: 07/13/2019 9:32 AM MRN: [id number] Endoscopist: Thornton Park MD, MD Age: 49 Referring MD:  Date of Birth: 01/31/1971 Gender: Female Account #: [id number] Procedure:                Colonoscopy Indications:              Screening for colorectal malignant neoplasm, This                            is the patient's first colonoscopy                           Mother and father with colon polyps in their 79s Medicines:                Monitored Anesthesia Care Procedure:                Pre-Anesthesia Assessment:                           - Prior to the procedure, a History and Physical                            was performed, and patient medications and                            allergies were reviewed. The patient's tolerance of                            previous anesthesia was also reviewed. The risks                            and benefits of the procedure and the sedation                            options and risks were discussed with the patient.                            All questions were answered, and informed consent                            was obtained. Prior Anticoagulants: The patient has                            taken no previous anticoagulant or antiplatelet                            agents. ASA Grade Assessment: II - A patient with                            mild systemic disease. After reviewing the risks                            and benefits, the patient was deemed in  satisfactory condition to undergo the procedure.                           After obtaining informed consent, the colonoscope                            was passed under direct vision. Throughout the                            procedure, the patient's blood pressure, pulse, and                            oxygen saturations were monitored continuously. The                            Colonoscope was  introduced through the anus and                            advanced to the 3 cm into the ileum. A second                            forward view of the right colon was performed. The                            colonoscopy was performed with difficulty due to a                            redundant colon, significant looping and a tortuous                            colon. Successful completion of the procedure was                            aided by applying abdominal pressure. The patient                            tolerated the procedure well. The quality of the                            bowel preparation was good. The terminal ileum,                            ileocecal valve, appendiceal orifice, and rectum                            were photographed. Scope In: 9:36:48 AM Scope Out: 9:52:35 AM Scope Withdrawal Time: 0 hours 11 minutes 17 seconds  Total Procedure Duration: 0 hours 15 minutes 47 seconds  Findings:                 Skin tags were found on perianal exam.                           Hemorrhoids were found on perianal exam.  Non-bleeding internal hemorrhoids were found.                           A 1 mm polyp was found in the hepatic flexure. The                            polyp was sessile. The polyp was removed with a                            piecemeal technique using a cold snare. Resection                            and retrieval were complete. Estimated blood loss                            was minimal.                           A few small and large-mouthed diverticula were                            found in the sigmoid colon, descending colon and                            ascending colon.                           The exam was otherwise without abnormality on                            direct and retroflexion views. Complications:            No immediate complications. Estimated blood loss:                            Minimal. Estimated  Blood Loss:     Estimated blood loss was minimal. Impression:               - Perianal skin tags found on perianal exam.                           - Hemorrhoids found on perianal exam.                           - Non-bleeding internal hemorrhoids.                           - One less than 1 mm polyp at the hepatic flexure,                            removed piecemeal using a cold snare. Resected and                            retrieved.                           -  The examination was otherwise normal on direct                            and retroflexion views. Recommendation:           - Patient has a contact number available for                            emergencies. The signs and symptoms of potential                            delayed complications were discussed with the                            patient. Return to normal activities tomorrow.                            Written discharge instructions were provided to the                            patient.                           - Resume previous diet.                           - Continue present medications.                           - Await pathology results.                           - Repeat colonoscopy date to be determined after                            pending pathology results are reviewed for                            surveillance.                           - Follow a high fiber diet. Drink at least 64                            ounces of water daily. Add a daily stool bulking                            agent such as psyllium (an exampled would be                            Metamucil).                           - Emerging evidence supports eating a diet of                            fruits,  vegetables, grains, calcium, and yogurt                            while reducing red meat and alcohol may reduce the                            risk of colon cancer.                           - Thank you for allowing me to be  involved in your                            colon cancer prevention. Thornton Park MD, MD 07/13/2019 9:58:53 AM This report has been signed electronically. [name],

## 2019-07-13 NOTE — Telephone Encounter (Signed)
-----   Message from [place], MD sent at 07/13/2019 10:02 AM EDT ----- Regarding: Referral to Dr. Nadeen Landau Patient requests referral to Dr. Nadeen Landau re: anal skin tag. She had a colonoscopy with me today.   Thank you!

## 2019-07-13 NOTE — Progress Notes (Signed)
Complete Physical  Assessment and Plan:  Encounter for general adult medical examination with abnormal findings 1 year  Malignant melanoma of upper extremity, including shoulder, unspecified laterality ([place]) Follow up with Derm ASAP, overdue  Calcium oxalate crystals in urine Monitor  Allergic rhinitis, unspecified seasonality, unspecified trigger - Allegra OTC, increase H20, allergy hygiene explained.  Medication management -     CBC with Differential/Platelet -     COMPLETE METABOLIC PANEL WITH GFR -     TSH -     Magnesium  Vitamin D deficiency -     VITAMIN D 25 Hydroxy (Vit-D Deficiency, Fractures)  Screening cholesterol level -     Lipid panel  Screening for blood or protein in urine -     Urinalysis, Routine w reflex microscopic -     Microalbumin / creatinine urine ratio  B12 deficiency -     Iron,Total/Total Iron Binding Cap -     Ferritin -     Vitamin B12  Screening for cardiovascular condition -     EKG 12-Lead  Chest pain, unspecified type Atypical, non exertional, no accompaniments Stop vitamin C, stop NSAIDS, add pepcid If not better follow up 1 month Go to the ER if any chest pain, shortness of breath, nausea, dizziness, severe HA, changes vision/speech  Tinea pedis of right foot -     ketoconazole (NIZORAL) 2 % cream; Apply 1 application topically 2 (two) times daily. -     triamcinolone cream (KENALOG) 0.5 %; Apply 1 application topically 2 (two) times daily. Keep area clean and dry  Acute pain of left shoulder -     DG Shoulder Left; Future Get Xray, at area of previous melanoma, follow up derm   Discussed med's effects and SE's. Screening labs and tests as requested with regular follow-up as recommended. Over 40 minutes of exam, counseling, chart review, and complex, high level critical decision making was performed this visit.  Future Appointments  Date Time Provider [place]  07/27/2019  2:30 PM Princess Bruins, MD [id number]  Mariane Baumgarten  07/18/2020 10:00 AM Vicie Mutters, PA-C [id number] None    HPI  49 y.o. female  presents for a complete physical.  Her blood pressure has been controlled at home, today their BP is BP: 118/70   She has been having some chest discomfort x 1 month, non exertional, no radiating, sharp pain at her chest. Last for 1 minute and goes away. Has been stressed. No accompaniments with it like SOB, nausea, dizziness, sweating, palpitations. She has been burping more than usual and feels like acid in her throat. She has been taking ibuprofen for sinuses.  ETOH once a week, wine or beer. She is on Vitamin C.   BMI is Body mass index is 29.72 kg/m., she is working on diet and exercise. Wt Readings from Last 3 Encounters:  07/15/19 167 lb 12.8 oz (76.1 kg)  07/13/19 168 lb (76.2 kg)  06/29/19 168 lb (76.2 kg)    She does workout, 30 mins workout, walking, yoga.  She denies chest pain, shortness of breath, dizziness.  She has had some fatigue, has been on integra for anemia and has B12 in it, wants to start exercising again, states she sleeps well.  Lab Results  Component Value Date   VITAMINB12 220 06/23/2018   She follows with Derm, follows once a year, had melanoma removed left shoulder 2013.  She is not on cholesterol medication and denies myalgias. Her cholesterol is not at goal. The cholesterol  last visit was:   Lab Results  Component Value Date   CHOL 153 06/23/2018   HDL 46 (L) 06/23/2018   LDLCALC 91 06/23/2018   TRIG 73 06/23/2018   CHOLHDL 3.3 06/23/2018    Last A1C in the office was:  Lab Results  Component Value Date   HGBA1C 5.6 06/23/2018   Patient is on Vitamin D supplement, liquid  Lab Results  Component Value Date   VD25OH 23 (L) 06/23/2018   Lab Results  Component Value Date   IRON 18 (L) 06/23/2018   TIBC 482 (H) 06/23/2018   FERRITIN 9 (L) 04/08/2016    Current Medications:  Current Outpatient Medications on File Prior to Visit  Medication Sig Dispense  Refill  . B Complex-C-Folic Acid (B COMPLEX-VITAMIN C-FOLIC ACID) 1 MG tablet Take 1 tablet by mouth daily with breakfast. 90 tablet 4  . Cholecalciferol (VITAMIN D PO) Take 1 tablet daily by mouth.    . Cyanocobalamin (VITAMIN B-12) 1000 MCG SUBL Place under the tongue daily.    . Fe Fum-FePoly-Vit C-Vit B3 (INTEGRA) 62.5-62.5-40-3 MG CAPS One pill daily PO for anemia 30 capsule 3  . Fexofenadine HCl (ALLEGRA PO) Take by mouth as needed.     . fluticasone (FLONASE) 50 MCG/ACT nasal spray Place 2 sprays into both nostrils daily. 16 g 3   No current facility-administered medications on file prior to visit.   Health Maintenance:   Immunization History  Administered Date(s) Administered  . Influenza Split 02/06/2015  . Influenza-Unspecified 01/05/2014  . PPD Test 03/21/2013, 03/24/2014  . Td 04/08/2003  . Tdap 03/24/2014   Tetanus: 2015 Pneumovax: N/A Prevnar 13:  N/A Flu vaccine: 2018  Zostavax: N/A  LMP: Patient's last menstrual period was 07/08/2019. Pap: 06/2018 every 1 years PAP/neg HPV, ASCUS Dr. [name] MGM: 01/2019 DEXA: N/A Colonoscopy: 07/13/19 1 polyp, diverticula Dr. [name] EGD: N/A  Last Dental Exam: Dr. [name] Last Eye Exam: [facility], contacts [place] VISIT Patient Care Team: [name], MD as PCP - General (Internal Medicine) [name], FNP as Nurse Practitioner (Obstetrics and Gynecology) [name], MD as Consulting Physician (Dermatology) [name], MD as Referring Physician (Allergy and Immunology)  Medical History:  Past Medical History:  Diagnosis Date  . Allergy   . B12 deficiency anemia   . Cancer ([place])    melanoma   . PONV (postoperative nausea and vomiting)    Allergies No Known Allergies  SURGICAL HISTORY She  has a past surgical history that includes Cesarean section; Tubal ligation; Melanoma excised; and Wisdom tooth extraction.   FAMILY HISTORY Her family history includes Cancer in her maternal grandmother,  paternal grandfather, and paternal grandmother; Colon polyps in her father and mother; Diabetes in her father; Heart disease in her maternal grandfather; Hypertension in her father and mother; Melanoma in her mother; Stroke in her mother.  Paternal Grandmother and Paternal aunt had menangiomas  SOCIAL HISTORY She  reports that she quit smoking about 21 years ago. She has never used smokeless tobacco. She reports current alcohol use. She reports that she does not use drugs.   Review of Systems: Review of Systems  Constitutional: Negative for chills, diaphoresis, fever, malaise/fatigue and weight loss.  HENT: Negative.   Eyes: Negative.   Respiratory: Negative.  Negative for cough and shortness of breath.   Cardiovascular: Negative for chest pain, palpitations, claudication, leg swelling and PND.  Gastrointestinal: Negative.   Genitourinary: Negative.   Musculoskeletal: Negative.   Skin: Negative.   Neurological:  Negative.  Negative for weakness.  Endo/Heme/Allergies: Negative.   Psychiatric/Behavioral: Negative.     Physical Exam: Estimated body mass index is 29.72 kg/m as calculated from the following:   Height as of this encounter: 5\' 3"  (1.6 m).   Weight as of this encounter: 167 lb 12.8 oz (76.1 kg). BP 118/70   Pulse 79   Temp (!) 97.5 F (36.4 C)   Ht 5\' 3"  (1.6 m)   Wt 167 lb 12.8 oz (76.1 kg)   LMP 07/08/2019   SpO2 99%   BMI 29.72 kg/m  General Appearance: Well nourished, in no apparent distress.  Eyes: PERRLA, EOMs, conjunctiva no swelling or erythema, normal fundi and vessels.  Sinuses: No Frontal/maxillary tenderness  ENT/Mouth: Ext aud canals clear, normal light reflex with TMs without erythema, bulging. Good dentition. No erythema, swelling, or exudate on post pharynx. Tonsils not swollen or erythematous. Hearing normal.  Neck: Supple, thyroid normal. No bruits  Respiratory: Respiratory effort normal, BS equal bilaterally without rales, rhonchi, wheezing or  stridor.  Cardio: RRR without murmurs, rubs or gallops. Brisk peripheral pulses without edema.  Chest: symmetric, with normal excursions and percussion.  Breasts: defer Abdomen: Soft, nontender, no guarding, rebound, hernias, masses, or organomegaly.  Lymphatics: Non tender without lymphadenopathy.  Genitourinary: defer Musculoskeletal: Full ROM all peripheral extremities,5/5 strength, and normal gait.  Skin: Warm, dry without rashes, lesions, ecchymosis. Neuro: Cranial nerves intact, reflexes equal bilaterally. Normal muscle tone, no cerebellar symptoms. Sensation intact.  Psych: Awake and oriented X 3, normal affect, Insight and Judgment appropriate.   EKG: WNL no ST changes AORTA SCAN: defer  Vicie Mutters 10:43 AM [facility]

## 2019-07-13 NOTE — Progress Notes (Signed)
LC- Temp KA - VS   Pt has contacts in her eyes and can take a nap in them.

## 2019-07-15 ENCOUNTER — Telehealth: Payer: Self-pay

## 2019-07-15 ENCOUNTER — Encounter: Payer: Self-pay | Admitting: Physician Assistant

## 2019-07-15 ENCOUNTER — Ambulatory Visit
Admission: RE | Admit: 2019-07-15 | Discharge: 2019-07-15 | Disposition: A | Discharge status: Home / Self Care | Payer: 59 | Source: Ambulatory Visit | Attending: Physician Assistant | Admitting: Physician Assistant

## 2019-07-15 ENCOUNTER — Ambulatory Visit (INDEPENDENT_AMBULATORY_CARE_PROVIDER_SITE_OTHER): Payer: 59 | Admitting: Physician Assistant

## 2019-07-15 ENCOUNTER — Other Ambulatory Visit: Payer: Self-pay

## 2019-07-15 VITALS — BP 118/70 | HR 79 | Temp 97.5°F | Ht 63.0 in | Wt 167.8 lb

## 2019-07-15 DIAGNOSIS — R079 Chest pain, unspecified: Secondary | ICD-10-CM

## 2019-07-15 DIAGNOSIS — Z13220 Encounter for screening for lipoid disorders: Secondary | ICD-10-CM

## 2019-07-15 DIAGNOSIS — E538 Deficiency of other specified B group vitamins: Secondary | ICD-10-CM

## 2019-07-15 DIAGNOSIS — C4360 Malignant melanoma of unspecified upper limb, including shoulder: Secondary | ICD-10-CM

## 2019-07-15 DIAGNOSIS — Z1389 Encounter for screening for other disorder: Secondary | ICD-10-CM

## 2019-07-15 DIAGNOSIS — E559 Vitamin D deficiency, unspecified: Secondary | ICD-10-CM

## 2019-07-15 DIAGNOSIS — Z0001 Encounter for general adult medical examination with abnormal findings: Secondary | ICD-10-CM

## 2019-07-15 DIAGNOSIS — I10 Essential (primary) hypertension: Secondary | ICD-10-CM | POA: Diagnosis not present

## 2019-07-15 DIAGNOSIS — Z0000 Encounter for general adult medical examination without abnormal findings: Secondary | ICD-10-CM | POA: Diagnosis not present

## 2019-07-15 DIAGNOSIS — Z136 Encounter for screening for cardiovascular disorders: Secondary | ICD-10-CM

## 2019-07-15 DIAGNOSIS — J309 Allergic rhinitis, unspecified: Secondary | ICD-10-CM

## 2019-07-15 DIAGNOSIS — Z79899 Other long term (current) drug therapy: Secondary | ICD-10-CM

## 2019-07-15 DIAGNOSIS — M25512 Pain in left shoulder: Secondary | ICD-10-CM

## 2019-07-15 DIAGNOSIS — B353 Tinea pedis: Secondary | ICD-10-CM

## 2019-07-15 DIAGNOSIS — R82998 Other abnormal findings in urine: Secondary | ICD-10-CM

## 2019-07-15 MED ORDER — KETOCONAZOLE 2 % EX CREA: 1.0000 "application " | TOPICAL_CREAM | Freq: Two times a day (BID) | CUTANEOUS | 0 refills | Status: DC

## 2019-07-15 MED ORDER — TRIAMCINOLONE ACETONIDE 0.5 % EX CREA: 1.0000 "application " | TOPICAL_CREAM | Freq: Two times a day (BID) | CUTANEOUS | 2 refills | Status: DC

## 2019-07-15 NOTE — Telephone Encounter (Signed)
  Follow up Call-  Call back number 07/13/2019  Post procedure Call Back phone  # [phone] cell  Permission to leave phone message Yes  Some recent data might be hidden     Patient questions:  Do you have a fever, pain , or abdominal swelling? No. Pain Score  0 *  Have you tolerated food without any problems? Yes.    Have you been able to return to your normal activities? Yes.    Do you have any questions about your discharge instructions: Diet   No. Medications  No. Follow up visit  No.  Do you have questions or concerns about your Care? No.  Actions: * If pain score is 4 or above: No action needed, pain <4.  1. Have you developed a fever since your procedure? No  2.   Have you had an respiratory symptoms (SOB or cough) since your procedure? No  3.   Have you tested positive for COVID 19 since your procedure No  4.   Have you had any family members/close contacts diagnosed with the COVID 19 since your procedure?  No   If yes to any of these questions please route to Joylene John, RN and Erenest Rasher, RN

## 2019-07-15 NOTE — Patient Instructions (Addendum)
Stop vitamin C Go to the ER if any chest pain, shortness of breath, nausea, dizziness, severe HA, changes vision/speech  Take omeprazole over the counter for 2 weeks,  pepcid 20 or 40mg  at night for 2 weeks, then you can stop or continue as needed.   Avoid alcohol, spicy foods, NSAIDS (aleve, ibuprofen) at this time. See foods below.   Make a derm appointment!  INFORMATION ABOUT YOUR XRAY  Can walk into [address] building for an Insurance account manager. They will have the order and take you back. You do not any paper work, I should get the result back today or tomorrow. This order is good for a year.  Can call [phone] to schedule an appointment if you wish.   Food Choices for Gastroesophageal Reflux Disease When you have gastroesophageal reflux disease (GERD), the foods you eat and your eating habits are very important. Choosing the right foods can help ease the discomfort of GERD. WHAT GENERAL GUIDELINES DO I NEED TO FOLLOW?  Choose fruits, vegetables, whole grains, low-fat dairy products, and low-fat meat, fish, and poultry.  Limit fats such as oils, salad dressings, butter, nuts, and avocado.  Keep a food diary to identify foods that cause symptoms.  Avoid foods that cause reflux. These may be different for different people.  Eat frequent small meals instead of three large meals each day.  Eat your meals slowly, in a relaxed setting.  Limit fried foods.  Cook foods using methods other than frying.  Avoid drinking alcohol.  Avoid drinking large amounts of liquids with your meals.  Avoid bending over or lying down until 2-3 hours after eating. WHAT FOODS ARE NOT RECOMMENDED? The following are some foods and drinks that may worsen your symptoms: Vegetables Tomatoes. Tomato juice. Tomato and spaghetti sauce. Chili peppers. Onion and garlic. Horseradish. Fruits Oranges, grapefruit, and lemon (fruit and juice). Meats High-fat meats, fish, and poultry. This includes hot dogs, ribs,  ham, sausage, salami, and bacon. Dairy Whole milk and chocolate milk. Sour cream. Cream. Butter. Ice cream. Cream cheese.  Beverages Coffee and tea, with or without caffeine. Carbonated beverages or energy drinks. Condiments Hot sauce. Barbecue sauce.  Sweets/Desserts Chocolate and cocoa. Donuts. Peppermint and spearmint. Fats and Oils High-fat foods, including [place] fries and potato chips. Other Vinegar. Strong spices, such as black pepper, white pepper, red pepper, cayenne, curry powder, cloves, ginger, and chili powder.

## 2019-07-16 LAB — URINALYSIS, ROUTINE W REFLEX MICROSCOPIC
Bilirubin Urine: NEGATIVE
Glucose, UA: NEGATIVE
Hgb urine dipstick: NEGATIVE
Ketones, ur: NEGATIVE
Leukocytes,Ua: NEGATIVE
Nitrite: NEGATIVE
Protein, ur: NEGATIVE
Specific Gravity, Urine: 1.004 (ref 1.001–1.03)
pH: 6.5 (ref 5.0–8.0)

## 2019-07-16 LAB — LIPID PANEL
Cholesterol: 167 mg/dL (ref <200)
HDL: 42 mg/dL — ABNORMAL LOW (ref >=50)
LDL Cholesterol (Calc): 105 mg/dL (calc) — ABNORMAL HIGH
Non-HDL Cholesterol (Calc): 125 mg/dL (calc) (ref <130)
Total CHOL/HDL Ratio: 4 (calc) (ref <5.0)
Triglycerides: 108 mg/dL (ref <150)

## 2019-07-16 LAB — COMPLETE METABOLIC PANEL WITH GFR
AG Ratio: 1.6 (calc) (ref 1.0–2.5)
ALT: 13 U/L (ref 6–29)
AST: 20 U/L (ref 10–35)
Albumin: 4.2 g/dL (ref 3.6–5.1)
Alkaline phosphatase (APISO): 67 U/L (ref 37–153)
BUN/Creatinine Ratio: 10 (calc) (ref 6–22)
BUN: 6 mg/dL — ABNORMAL LOW (ref 7–25)
CO2: 25 mmol/L (ref 20–32)
Calcium: 9.1 mg/dL (ref 8.6–10.4)
Chloride: 105 mmol/L (ref 98–110)
Creat: 0.63 mg/dL (ref 0.50–1.05)
GFR, Est African American: 121 mL/min/{1.73_m2} (ref >=60)
GFR, Est Non African American: 105 mL/min/{1.73_m2} (ref >=60)
Globulin: 2.7 g/dL (calc) (ref 1.9–3.7)
Glucose, Bld: 90 mg/dL (ref 65–99)
Potassium: 4.1 mmol/L (ref 3.5–5.3)
Sodium: 138 mmol/L (ref 135–146)
Total Bilirubin: 0.3 mg/dL (ref 0.2–1.2)
Total Protein: 6.9 g/dL (ref 6.1–8.1)

## 2019-07-16 LAB — IRON, TOTAL/TOTAL IRON BINDING CAP
%SAT: 5 % (calc) — ABNORMAL LOW (ref 16–45)
Iron: 21 ug/dL — ABNORMAL LOW (ref 45–160)
TIBC: 446 mcg/dL (calc) (ref 250–450)

## 2019-07-16 LAB — CBC WITH DIFFERENTIAL/PLATELET
Absolute Monocytes: 401 cells/uL (ref 200–950)
Basophils Absolute: 41 cells/uL (ref 0–200)
Basophils Relative: 0.7 %
Eosinophils Absolute: 159 cells/uL (ref 15–500)
Eosinophils Relative: 2.7 %
HCT: 32.1 % — ABNORMAL LOW (ref 35.0–45.0)
Hemoglobin: 10 g/dL — ABNORMAL LOW (ref 11.7–15.5)
Lymphs Abs: 1481 cells/uL (ref 850–3900)
MCH: 24.4 pg — ABNORMAL LOW (ref 27.0–33.0)
MCHC: 31.2 g/dL — ABNORMAL LOW (ref 32.0–36.0)
MCV: 78.3 fL — ABNORMAL LOW (ref 80.0–100.0)
MPV: 10.9 fL (ref 7.5–12.5)
Monocytes Relative: 6.8 %
Neutro Abs: 3817 cells/uL (ref 1500–7800)
Neutrophils Relative %: 64.7 %
Platelets: 345 10*3/uL (ref 140–400)
RBC: 4.1 10*6/uL (ref 3.80–5.10)
RDW: 16.7 % — ABNORMAL HIGH (ref 11.0–15.0)
Total Lymphocyte: 25.1 %
WBC: 5.9 10*3/uL (ref 3.8–10.8)

## 2019-07-16 LAB — FERRITIN: Ferritin: 5 ng/mL — ABNORMAL LOW (ref 16–232)

## 2019-07-16 LAB — VITAMIN D 25 HYDROXY (VIT D DEFICIENCY, FRACTURES): Vit D, 25-Hydroxy: 21 ng/mL — ABNORMAL LOW (ref 30–100)

## 2019-07-16 LAB — MICROALBUMIN / CREATININE URINE RATIO
Creatinine, Urine: 24 mg/dL (ref 20–275)
Microalb, Ur: <0.2 mg/dL

## 2019-07-16 LAB — MAGNESIUM: Magnesium: 2 mg/dL (ref 1.5–2.5)

## 2019-07-16 LAB — TSH: TSH: 0.67 mIU/L

## 2019-07-16 LAB — VITAMIN B12: Vitamin B-12: 262 pg/mL (ref 200–1100)

## 2019-07-18 MED ORDER — VITAMIN D (ERGOCALCIFEROL) 1.25 MG (50000 UNIT) PO CAPS: ORAL_CAPSULE | ORAL | 1 refills | Status: DC

## 2019-07-18 MED ORDER — CYANOCOBALAMIN 1000 MCG/ML IJ SOLN: INTRAMUSCULAR | 2 refills | Status: DC

## 2019-07-18 MED ORDER — "BD SYRINGE/NEEDLE 25G X 5/8"" 1 ML MISC": 1 refills | Status: DC

## 2019-07-19 ENCOUNTER — Encounter: Payer: Self-pay | Admitting: Gastroenterology

## 2019-07-26 ENCOUNTER — Other Ambulatory Visit: Payer: Self-pay

## 2019-07-27 ENCOUNTER — Encounter: Payer: 59 | Admitting: Obstetrics & Gynecology

## 2019-08-10 ENCOUNTER — Ambulatory Visit (INDEPENDENT_AMBULATORY_CARE_PROVIDER_SITE_OTHER): Payer: 59 | Admitting: Obstetrics & Gynecology

## 2019-08-10 ENCOUNTER — Other Ambulatory Visit: Payer: Self-pay

## 2019-08-10 ENCOUNTER — Encounter: Payer: Self-pay | Admitting: Obstetrics & Gynecology

## 2019-08-10 VITALS — BP 128/86 | Ht 63.0 in | Wt 164.0 lb

## 2019-08-10 DIAGNOSIS — Z01419 Encounter for gynecological examination (general) (routine) without abnormal findings: Secondary | ICD-10-CM

## 2019-08-10 DIAGNOSIS — Z9851 Tubal ligation status: Secondary | ICD-10-CM

## 2019-08-10 DIAGNOSIS — R87610 Atypical squamous cells of undetermined significance on cytologic smear of cervix (ASC-US): Secondary | ICD-10-CM

## 2019-08-10 DIAGNOSIS — N920 Excessive and frequent menstruation with regular cycle: Secondary | ICD-10-CM | POA: Diagnosis not present

## 2019-08-10 DIAGNOSIS — D649 Anemia, unspecified: Secondary | ICD-10-CM

## 2019-08-10 DIAGNOSIS — E663 Overweight: Secondary | ICD-10-CM

## 2019-08-10 MED ORDER — NORETHINDRONE 0.35 MG PO TABS: 1.0000 | ORAL_TABLET | Freq: Every day | ORAL | 4 refills | Status: DC

## 2019-08-10 NOTE — Progress Notes (Signed)
Anna Hernandez 03-Sep-1970 [id number]   History:    49 y.o. G3P3L4 Married.  S/P TL.  Children twin 52 yo, 66 yo and 57 yo.  RP:  Established patient presenting for annual gyn exam   HPI: Menses light but every 3 weeks recently. No BTB.  Secondary anemia with Hb at 10 in 07/2019.  No pelvic pain.  No pain with IC.  Urine/BMs normal.  Breasts normal.  BMI 29.05.  Not exercising regularly.  Health labs with Fam MD.   Past medical history,surgical history, family history and social history were all reviewed and documented in the EPIC chart.  Gynecologic History Patient's last menstrual period was 08/01/2019.  Obstetric History OB History  Gravida Para Term Preterm AB Living  3 3       4   SAB TAB Ectopic Multiple Live Births        1      # Outcome Date GA Lbr Len/2nd Weight Sex Delivery Anes PTL Lv  3 Para           2 Para           1 Para              ROS: A ROS was performed and pertinent positives and negatives are included in the history.  GENERAL: No fevers or chills. HEENT: No change in vision, no earache, sore throat or sinus congestion. NECK: No pain or stiffness. CARDIOVASCULAR: No chest pain or pressure. No palpitations. PULMONARY: No shortness of breath, cough or wheeze. GASTROINTESTINAL: No abdominal pain, nausea, vomiting or diarrhea, melena or bright red blood per rectum. GENITOURINARY: No urinary frequency, urgency, hesitancy or dysuria. MUSCULOSKELETAL: No joint or muscle pain, no back pain, no recent trauma. DERMATOLOGIC: No rash, no itching, no lesions. ENDOCRINE: No polyuria, polydipsia, no heat or cold intolerance. No recent change in weight. HEMATOLOGICAL: No anemia or easy bruising or bleeding. NEUROLOGIC: No headache, seizures, numbness, tingling or weakness. PSYCHIATRIC: No depression, no loss of interest in normal activity or change in sleep pattern.     Exam:   BP 128/86   Ht 5\' 3"  (1.6 m)   Wt 164 lb (74.4 kg)   LMP 08/01/2019   BMI 29.05  kg/m   Body mass index is 29.05 kg/m.  General appearance : Well developed well nourished female. No acute distress HEENT: Eyes: no retinal hemorrhage or exudates,  Neck supple, trachea midline, no carotid bruits, no thyroidmegaly Lungs: Clear to auscultation, no rhonchi or wheezes, or rib retractions  Heart: Regular rate and rhythm, no murmurs or gallops Breast:Examined in sitting and supine position were symmetrical in appearance, no palpable masses or tenderness,  no skin retraction, no nipple inversion, no nipple discharge, no skin discoloration, no axillary or supraclavicular lymphadenopathy Abdomen: no palpable masses or tenderness, no rebound or guarding Extremities: no edema or skin discoloration or tenderness  Pelvic: Vulva: Normal             Vagina: No gross lesions or discharge  Cervix: No gross lesions or discharge.  Pap reflex done.  Uterus  AV, normal size, shape and consistency, non-tender and mobile  Adnexa  Without masses or tenderness  Anus: Normal   Assessment/Plan:  49 y.o. female for annual exam   1. Encounter for gynecological examination with Papanicolaou smear of cervix Normal gynecologic exam.  Pap test last year showed ASCUS, Pap reflex repeated this year.  Breast exam normal.  Screening mammogram October 2020 was negative.  Colonoscopy 2021.  Health labs with family physician.  2. ASCUS of cervix with negative high risk HPV Pap reflex done today.  3. S/P tubal ligation  4. Polymenorrhea Frequent menses at least every 3 weeks.  Secondary anemia with hemoglobin at 16 July 2019.  Decision to control with norethindrone 1 tablet daily.  No contraindication.  Usage reviewed and prescription sent to pharmacy.  5. Secondary anemia Will control the cycle with the progestin only pill.  Recommend increased iron in food and a supplement of iron sulfate 325 mg twice daily.  6. Overweight (BMI 25.0-29.9) Recommend a lower calorie/carb diet such as [employer]	[place].  Aerobic activities 5 times a week and light weightlifting every 2 days.  Other orders - norethindrone (MICRONOR) 0.35 MG tablet; Take 1 tablet (0.35 mg total) by mouth daily.  Princess Bruins MD, 12:38 PM 08/10/2019

## 2019-08-12 LAB — PAP IG W/ RFLX HPV ASCU

## 2019-08-14 ENCOUNTER — Encounter: Payer: Self-pay | Admitting: Obstetrics & Gynecology

## 2019-08-14 NOTE — Patient Instructions (Signed)
1. Encounter for gynecological examination with Papanicolaou smear of cervix Normal gynecologic exam.  Pap test last year showed ASCUS, Pap reflex repeated this year.  Breast exam normal.  Screening mammogram October 2020 was negative.  Colonoscopy 2021.  Health labs with family physician.  2. ASCUS of cervix with negative high risk HPV Pap reflex done today.  3. S/P tubal ligation  4. Polymenorrhea Frequent menses at least every 3 weeks.  Secondary anemia with hemoglobin at 16 July 2019.  Decision to control with norethindrone 1 tablet daily.  No contraindication.  Usage reviewed and prescription sent to pharmacy.  5. Secondary anemia Will control the cycle with the progestin only pill.  Recommend increased iron in food and a supplement of iron sulfate 325 mg twice daily.  6. Overweight (BMI 25.0-29.9) Recommend a lower calorie/carb diet such as [employer].  Aerobic activities 5 times a week and light weightlifting every 2 days.  Other orders - norethindrone (MICRONOR) 0.35 MG tablet; Take 1 tablet (0.35 mg total) by mouth daily.  Anna Hernandez, it was a pleasure seeing you today!  I will inform you of your results as soon as they are available.

## 2019-09-07 ENCOUNTER — Ambulatory Visit: Payer: Self-pay | Admitting: General Surgery

## 2019-09-07 NOTE — H&P (Signed)
The patient is a 49 year old female who presents with a complaint of anal problems. 50 yo F who presents to the office with complaints of an anal skin tag. She states that this has been present for many years. She denies any itching, burning, bleeding or pain. She just complains of occasional irritation. She is having regular bowel movements.   Past Surgical History Mammie Lorenzo, LPN; [id number] AM) Cesarean Section - 1  Colon Polyp Removal - Colonoscopy   Diagnostic Studies History Mammie Lorenzo, LPN; [id number] QA348G AM) Colonoscopy  within last year Mammogram  within last year Pap Smear  1-5 years ago  Allergies Mammie Lorenzo, LPN; [id number] QA348G AM) No Known Drug Allergies  [09/07/2019]: Allergies Reconciled   Medication History Mammie Lorenzo, LPN; X33443 QA348G AM) Cyanocobalamin (1000MCG/ML Solution, Injection) Active. Vitamin D (Ergocalciferol) (1.25 MG(50000 UT) Capsule, Oral) Active. Allegra (30MG  Tablet, Oral) Active. Iron (325 (65 Fe)MG Tablet, Oral) Active. Medications Reconciled  Social History Mammie Lorenzo, LPN; [id number] QA348G AM) Alcohol use  Occasional alcohol use. Caffeine use  Coffee. No drug use  Tobacco use  Former smoker.  Family History Mammie Lorenzo, LPN; [id number] QA348G AM) Anesthetic complications  Mother. Diabetes Mellitus  Father. Hypertension  Father, Mother. Melanoma  Mother. Prostate Cancer  Father.  Pregnancy / Birth History Mammie Lorenzo, LPN; [id number] QA348G AM) Age at menarche  16 years. Gravida  3 Length (months) of breastfeeding  7-12 Maternal age  39-30 Para  4    Review of Systems Mammie Lorenzo LPN; [id number] QA348G AM) General Not Present- Appetite Loss, Chills, Fatigue, Fever, Night Sweats, Weight Gain and Weight Loss. Skin Not Present- Change in Wart/Mole, Dryness, Hives, Jaundice, New Lesions, Non-Healing Wounds, Rash and Ulcer. HEENT Present- Seasonal Allergies. Not Present- Earache, Hearing  Loss, Hoarseness, Nose Bleed, Oral Ulcers, Ringing in the Ears, Sinus Pain, Sore Throat, Visual Disturbances, Wears glasses/contact lenses and Yellow Eyes. Respiratory Not Present- Bloody sputum, Chronic Cough, Difficulty Breathing, Snoring and Wheezing. Breast Not Present- Breast Mass, Breast Pain, Nipple Discharge and Skin Changes. Cardiovascular Not Present- Chest Pain, Difficulty Breathing Lying Down, Leg Cramps, Palpitations, Rapid Heart Rate, Shortness of Breath and Swelling of Extremities. Gastrointestinal Not Present- Abdominal Pain, Bloating, Bloody Stool, Change in Bowel Habits, Chronic diarrhea, Constipation, Difficulty Swallowing, Excessive gas, Gets full quickly at meals, Hemorrhoids, Indigestion, Nausea, Rectal Pain and Vomiting. Female Genitourinary Present- Nocturia. Not Present- Frequency, Painful Urination, Pelvic Pain and Urgency. Musculoskeletal Not Present- Back Pain, Joint Pain, Joint Stiffness, Muscle Pain, Muscle Weakness and Swelling of Extremities. Neurological Not Present- Decreased Memory, Fainting, Headaches, Numbness, Seizures, Tingling, Tremor, Trouble walking and Weakness. Psychiatric Not Present- Anxiety, Bipolar, Change in Sleep Pattern, Depression, Fearful and Frequent crying. Endocrine Not Present- Cold Intolerance, Excessive Hunger, Hair Changes, Heat Intolerance, Hot flashes and New Diabetes. Hematology Not Present- Blood Thinners, Easy Bruising, Excessive bleeding, Gland problems, HIV and Persistent Infections.  [employer]orne Billings Dockery LPN; [id number] QA348G AM) 09/07/2019 9:29 AM Weight: 166.4 lb Height: 63in Body Surface Area: 1.79 m Body Mass Index: 29.48 kg/m  Temp.: 98.79F (Thermal Scan)  Pulse: 97 (Regular)  BP: 118/68(Sitting, Left Arm, Standard)       Physical Exam Leighton Ruff MD; [id number] 10:07 AM) General Mental Status-Alert. General Appearance-Cooperative.  Abdomen Palpation/Percussion Palpation and Percussion of the  abdomen reveal - Soft.  Rectal Anorectal Exam External - skin tag.    Assessment & Plan Leighton Ruff MD; [id number] 9:40 AM) ANAL SKIN TAG (K64.4) Impression: 49 year old female with a relatively large left lateral  anal skin tag. We discussed that removing the skin tag may benefit her in the future in regards to her skin irritation. We discussed that these can recur if she develops more hemorrhoids in the future. We discussed typical postoperative pain and recovery.

## 2019-09-07 NOTE — H&P (Signed)
The patient is a 49 year old female who presents with a complaint of anal problems. 49 yo F who presents to the office with complaints of an anal skin tag.  She states that this has been present for many years.  She denies any itching, burning, bleeding or pain.  She just complains of occasional irritation.  She is having regular bowel movements.   Past Surgical History Mammie Lorenzo, LPN; [id number] [id number] AM) Cesarean Section - 1  Colon Polyp Removal - Colonoscopy   Diagnostic Studies History Mammie Lorenzo, LPN; [id number] QA348G AM) Colonoscopy  within last year Mammogram  within last year Pap Smear  1-5 years ago  Allergies Mammie Lorenzo, LPN; [id number] QA348G AM) No Known Drug Allergies  [09/07/2019]: Allergies Reconciled   Medication History Mammie Lorenzo, LPN; X33443 QA348G AM) Cyanocobalamin  (1000MCG/ML Solution, Injection) Active. Vitamin D (Ergocalciferol)  (1.25 MG(50000 UT) Capsule, Oral) Active. Allegra  (30MG  Tablet, Oral) Active. Iron  (325 (65 Fe)MG Tablet, Oral) Active. Medications Reconciled   Social History Mammie Lorenzo, LPN; [id number] QA348G AM) Alcohol use  Occasional alcohol use. Caffeine use  Coffee. No drug use  Tobacco use  Former smoker.  Family History Mammie Lorenzo, LPN; [id number] QA348G AM) Anesthetic complications  Mother. Diabetes Mellitus  Father. Hypertension  Father, Mother. Melanoma  Mother. Prostate Cancer  Father.  Pregnancy / Birth History Mammie Lorenzo, LPN; [id number] QA348G AM) Age at menarche  82 years. Gravida  3 Length (months) of breastfeeding  7-12 Maternal age  67-30 Para  4     Review of Systems Mammie Lorenzo LPN; [id number] QA348G AM) General Not Present- Appetite Loss, Chills, Fatigue, Fever, Night Sweats, Weight Gain and Weight Loss. Skin Not Present- Change in Wart/Mole, Dryness, Hives, Jaundice, New Lesions, Non-Healing Wounds, Rash and Ulcer. HEENT Present- Seasonal Allergies. Not Present- Earache, Hearing Loss, Hoarseness, Nose  Bleed, Oral Ulcers, Ringing in the Ears, Sinus Pain, Sore Throat, Visual Disturbances, Wears glasses/contact lenses and Yellow Eyes. Respiratory Not Present- Bloody sputum, Chronic Cough, Difficulty Breathing, Snoring and Wheezing. Breast Not Present- Breast Mass, Breast Pain, Nipple Discharge and Skin Changes. Cardiovascular Not Present- Chest Pain, Difficulty Breathing Lying Down, Leg Cramps, Palpitations, Rapid Heart Rate, Shortness of Breath and Swelling of Extremities. Gastrointestinal Not Present- Abdominal Pain, Bloating, Bloody Stool, Change in Bowel Habits, Chronic diarrhea, Constipation, Difficulty Swallowing, Excessive gas, Gets full quickly at meals, Hemorrhoids, Indigestion, Nausea, Rectal Pain and Vomiting. Female Genitourinary Present- Nocturia. Not Present- Frequency, Painful Urination, Pelvic Pain and Urgency. Musculoskeletal Not Present- Back Pain, Joint Pain, Joint Stiffness, Muscle Pain, Muscle Weakness and Swelling of Extremities. Neurological Not Present- Decreased Memory, Fainting, Headaches, Numbness, Seizures, Tingling, Tremor, Trouble walking and Weakness. Psychiatric Not Present- Anxiety, Bipolar, Change in Sleep Pattern, Depression, Fearful and Frequent crying. Endocrine Not Present- Cold Intolerance, Excessive Hunger, Hair Changes, Heat Intolerance, Hot flashes and New Diabetes. Hematology Not Present- Blood Thinners, Easy Bruising, Excessive bleeding, Gland problems, HIV and Persistent Infections.  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 General Appearance - Cooperative.  Abdomen Palpation/Percussion Palpation and Percussion of the abdomen reveal -  Soft.  Rectal Anorectal Exam External -  skin tag.    Assessment & Plan Leighton Ruff MD; [id number] 9:40 AM)  ANAL SKIN TAG (K64.4) Impression: 49 year old female with a relatively large left lateral anal skin tag. We discussed that removing the skin tag may benefit her in the future in regards to her skin irritation. We discussed that these can recur if she develops more hemorrhoids in the future. We discussed typical postoperative pain and recovery.

## 2019-12-13 MED ORDER — POLYMYXIN B-TRIMETHOPRIM 10000-0.1 UNIT/ML-% OP SOLN: 1.0000 [drp] | OPHTHALMIC | 0 refills | Status: DC

## 2020-01-30 ENCOUNTER — Encounter: Payer: Self-pay | Admitting: Nurse Practitioner

## 2020-04-20 ENCOUNTER — Other Ambulatory Visit: Payer: 59

## 2020-04-20 DIAGNOSIS — Z20822 Contact with and (suspected) exposure to covid-19: Secondary | ICD-10-CM

## 2020-04-24 LAB — NOVEL CORONAVIRUS, NAA: SARS-CoV-2, NAA: DETECTED — AB

## 2020-04-25 ENCOUNTER — Telehealth: Payer: Self-pay

## 2020-04-25 NOTE — Telephone Encounter (Signed)
Called to discuss with patient about COVID-19 symptoms and the use of one of the available treatments for those with mild to moderate Covid symptoms and at a high risk of hospitalization.  Pt appears to qualify for outpatient treatment due to co-morbid conditions and/or a member of an at-risk group in accordance with the FDA Emergency Use Authorization.    Symptom onset: Unknown Vaccinated: Yes Booster? Unknown Immunocompromised? No Qualifiers: None  Unable to reach pt - Left message with call back number [phone].  Anna Hernandez

## 2020-05-23 ENCOUNTER — Other Ambulatory Visit: Payer: Self-pay

## 2020-05-23 ENCOUNTER — Encounter: Payer: Self-pay | Admitting: Adult Health Nurse Practitioner

## 2020-05-23 ENCOUNTER — Ambulatory Visit: Payer: 59 | Admitting: Adult Health Nurse Practitioner

## 2020-05-23 VITALS — BP 100/60 | HR 80 | Temp 97.9°F | Wt 159.0 lb

## 2020-05-23 DIAGNOSIS — J019 Acute sinusitis, unspecified: Secondary | ICD-10-CM

## 2020-05-23 DIAGNOSIS — J309 Allergic rhinitis, unspecified: Secondary | ICD-10-CM

## 2020-05-23 DIAGNOSIS — G4452 New daily persistent headache (NDPH): Secondary | ICD-10-CM | POA: Diagnosis not present

## 2020-05-23 MED ORDER — AZITHROMYCIN 250 MG PO TABS: ORAL_TABLET | ORAL | 1 refills | Status: AC

## 2020-05-23 MED ORDER — PREDNISONE 10 MG (21) PO TBPK: ORAL_TABLET | Freq: Every day | ORAL | 0 refills | Status: DC

## 2020-05-23 NOTE — Progress Notes (Signed)
Assessment and Plan:  Lavayah was seen today for acute visit and sinus problem.  Diagnoses and all orders for this visit:  Acute non-recurrent sinusitis, unspecified location -     azithromycin (ZITHROMAX) 250 MG tablet; Take 2 tablets (500 mg) on  Day 1,  followed by 1 tablet (250 mg) once daily on Days 2 through 5. -     predniSONE (STERAPRED UNI-PAK 21 TAB) 10 MG (21) TBPK tablet; Take by mouth daily. Follow directions on Taper pack  Allergic rhinitis, unspecified seasonality, unspecified trigger Discussed antihistamine, cetirazine Continue Neti pot use, BID, bottle or distilled water only  New daily persistent headache Likely related to sinus Discussed rotating ibuprofen 600mg  every 6 hours and tylenol   Contact office with any new or worsening symptoms.    Further disposition pending results of labs. Discussed med's effects and SE's.   Over 20 minutes of face to face exam, counseling, chart review, and critical decision making was performed.   Future Appointments  Date Time Provider [place]  07/18/2020 10:00 AM Keela Rubert, Danton Sewer, NP [facility] None    ------------------------------------------------------------------------------------------------------------------   HPI 50 y.o.female presents for evaluation of symptoms s/p covid. She did have an appointment two weeks after COVID.  She she was prescribed flonasee to help with her symptoms. Today she continues to have drainage but it is mainly nasal congestion.  She has also increased her fluids.  She has been taking sudafed and allegra feels like it will not come out. She has also used a Neti pot that has not provid any resolution of nasal congestion.She has also used Ibuprofen. Dull headache that constant. It is waking her up out of her sleep.  She is also having right eye drainage.  Wears contacts but has been wearing glasses since then.  Both eyes have been crusty in am.  This clears after cleansing face and does  not return during the day.   Past Medical History:  Diagnosis Date  . Allergy   . B12 deficiency anemia   . Cancer ([place])    melanoma   . PONV (postoperative nausea and vomiting)      No Known Allergies  Current Outpatient Medications on File Prior to Visit  Medication Sig  . B Complex-C-Folic Acid (B COMPLEX-VITAMIN C-FOLIC ACID) 1 MG tablet Take 1 tablet by mouth daily with breakfast.  . Cholecalciferol (VITAMIN D PO) Take 1 tablet daily by mouth.  . cyanocobalamin (,VITAMIN B-12,) 1000 MCG/ML injection Inject 1055mcg once weekly for 6-8 weeks, then once a month  . Fe Fum-FePoly-Vit C-Vit B3 (INTEGRA) 62.5-62.5-40-3 MG CAPS One pill daily PO for anemia  . Fexofenadine HCl (ALLEGRA PO) Take by mouth as needed.  . fluticasone (FLONASE) 50 MCG/ACT nasal spray Place 2 sprays into both nostrils daily.  . norethindrone (MICRONOR) 0.35 MG tablet Take 1 tablet (0.35 mg total) by mouth daily.  . SYRINGE/NEEDLE, DISP, 1 ML (B-D SYRINGE/NEEDLE 1CC/25GX5/8) 25G X 5/8" 1 ML MISC 1 SQ injection once weekly  . Vitamin D, Ergocalciferol, (DRISDOL) 1.25 MG (50000 UNIT) CAPS capsule 1 pill 2 days a week for vitamin d deficiency   No current facility-administered medications on file prior to visit.    ROS: all negative except above.   Physical Exam:  BP 100/60   Pulse 80   Temp 97.9 F (36.6 C)   Wt 159 lb (72.1 kg)   SpO2 98%   BMI 28.17 kg/m   General Appearance: Well nourished, in no apparent distress. Eyes: PERRLA, EOMs, conjunctiva  no swelling or erythema Sinuses: Frontal/maxillary tenderness ENT/Mouth: Ext aud canals clear, TMs without erythema, bulging. No erythema, swelling, or exudate on post pharynx.  Tonsils not swollen or erythematous. Hearing normal.  Neck: Supple, thyroid normal.  Respiratory: Respiratory effort normal, BS equal bilaterally without rales, rhonchi, wheezing or stridor.  Cardio: RRR with no MRGs. Brisk peripheral pulses without edema.  Abdomen: Soft, + BS.   Non tender, no guarding, rebound, hernias, masses. Lymphatics: Non tender without lymphadenopathy.  Musculoskeletal: Full ROM, 5/5 strength, normal gait.  Skin: Warm, dry without rashes, lesions, ecchymosis.  Neuro: Cranial nerves intact. Normal muscle tone, no cerebellar symptoms. Sensation intact.  Psych: Awake and oriented X 3, normal affect, Insight and Judgment appropriate.       Garnet Sierras, Laqueta Jean, DNP [facility] 05/23/2020  11:58 AM

## 2020-06-28 ENCOUNTER — Other Ambulatory Visit: Payer: Self-pay

## 2020-06-28 MED ORDER — VITAMIN D (ERGOCALCIFEROL) 1.25 MG (50000 UNIT) PO CAPS: ORAL_CAPSULE | ORAL | 1 refills | Status: DC

## 2020-06-28 NOTE — Telephone Encounter (Signed)
REFILL ON VIT D 50,000IU

## 2020-07-18 ENCOUNTER — Encounter: Payer: 59 | Admitting: Adult Health Nurse Practitioner

## 2020-08-01 ENCOUNTER — Other Ambulatory Visit: Payer: Self-pay

## 2020-08-01 ENCOUNTER — Ambulatory Visit (INDEPENDENT_AMBULATORY_CARE_PROVIDER_SITE_OTHER): Payer: 59 | Admitting: Adult Health

## 2020-08-01 ENCOUNTER — Encounter: Payer: Self-pay | Admitting: Adult Health

## 2020-08-01 VITALS — BP 118/74 | HR 78 | Temp 97.5°F | Ht 63.0 in | Wt 158.0 lb

## 2020-08-01 DIAGNOSIS — Z0000 Encounter for general adult medical examination without abnormal findings: Secondary | ICD-10-CM | POA: Diagnosis not present

## 2020-08-01 DIAGNOSIS — E663 Overweight: Secondary | ICD-10-CM

## 2020-08-01 DIAGNOSIS — Z79899 Other long term (current) drug therapy: Secondary | ICD-10-CM

## 2020-08-01 DIAGNOSIS — D509 Iron deficiency anemia, unspecified: Secondary | ICD-10-CM | POA: Insufficient documentation

## 2020-08-01 DIAGNOSIS — E785 Hyperlipidemia, unspecified: Secondary | ICD-10-CM | POA: Insufficient documentation

## 2020-08-01 DIAGNOSIS — E538 Deficiency of other specified B group vitamins: Secondary | ICD-10-CM

## 2020-08-01 DIAGNOSIS — R82998 Other abnormal findings in urine: Secondary | ICD-10-CM

## 2020-08-01 DIAGNOSIS — E559 Vitamin D deficiency, unspecified: Secondary | ICD-10-CM

## 2020-08-01 DIAGNOSIS — Z1329 Encounter for screening for other suspected endocrine disorder: Secondary | ICD-10-CM

## 2020-08-01 DIAGNOSIS — C4360 Malignant melanoma of unspecified upper limb, including shoulder: Secondary | ICD-10-CM

## 2020-08-01 DIAGNOSIS — Z86010 Personal history of colonic polyps: Secondary | ICD-10-CM

## 2020-08-01 DIAGNOSIS — Z131 Encounter for screening for diabetes mellitus: Secondary | ICD-10-CM

## 2020-08-01 DIAGNOSIS — J309 Allergic rhinitis, unspecified: Secondary | ICD-10-CM

## 2020-08-01 NOTE — Progress Notes (Signed)
Complete Physical  Assessment and Plan:  Encounter for general adult medical examination with abnormal findings 1 year  Malignant melanoma of upper extremity, including shoulder, unspecified laterality ([place]) Follows with derm annually  Calcium oxalate crystals in urine Monitor, push fluids  Allergic rhinitis, unspecified seasonality, unspecified trigger - Allegra OTC, increase H20, allergy hygiene explained.  Medication management -     CBC with Differential/Platelet -     COMPLETE METABOLIC PANEL WITH GFR -     TSH -     Magnesium  Vitamin D deficiency -     VITAMIN D 25 Hydroxy (Vit-D Deficiency, Fractures)  Hyperlipidemia Mild elevations managed by lifestyle Continue low cholesterol diet and exercise.  Check lipid panel.  -     Lipid panel -     TSH  Screening for blood or protein in urine -     Urinalysis, Routine w reflex microscopic  Screening diabetes/family history of diabetes      -      A1C  Iron/b12 def, anemia -     CBC -     Iron,Total/Total Iron Binding Cap -     Ferritin -     Vitamin B12   Discussed med's effects and SE's. Screening labs and tests as requested with regular follow-up as recommended. Over 40 minutes of exam, counseling, chart review, and complex, high level critical decision making was performed this visit.  Future Appointments  Date Time Provider [place]  01/23/2021  1:30 PM Orma Flaming, MD [facility]  08/01/2021  3:00 PM Liane Comber, NP [facility] None    HPI  50 y.o. female  presents for a complete physical. She has Melanoma ([place]); Allergic rhinitis; Calcium oxalate crystals in urine; Overweight (BMI 25.0-29.9); History of adenomatous polyp of colon; Iron deficiency anemia; and B12 deficiency on their problem list.   She is married, 4 kids, 2 oldest, 61 y/o twins. 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.   She does follow with GYN, still having menstrual cycles, has had tubes tied.   She follows with  [facility] derm, follows once a year, had melanoma removed left shoulder 2013 and had BCC to temple.   BMI is Body mass index is 27.99 kg/m., she has been working on diet and exercise, does some intermittent fasting.  Water - trying to increase, currently working up to 65+  2 cups of coffee Wt Readings from Last 3 Encounters:  08/01/20 158 lb (71.7 kg)  05/23/20 159 lb (72.1 kg)  08/10/19 164 lb (74.4 kg)   Her blood pressure has been controlled at home, today their BP is BP: 118/74  She does workout, 30 mins workout, walking, yoga.  She denies chest pain, shortness of breath, dizziness.   She is not on cholesterol medication and denies myalgias. Her cholesterol is not at goal. The cholesterol last visit was:   Lab Results  Component Value Date   CHOL 167 07/15/2019   HDL 42 (L) 07/15/2019   LDLCALC 105 (H) 07/15/2019   TRIG 108 07/15/2019   CHOLHDL 4.0 07/15/2019    Last A1C in the office was:  Lab Results  Component Value Date   HGBA1C 5.6 06/23/2018   Patient is on Vitamin D supplement, has been taking 50000 IU twice weekly  Lab Results  Component Value Date   VD25OH 21 (L) 07/15/2019   She has had some fatigue, has been on iron supplement probiotic CBC Latest Ref Rng & Units 07/15/2019 06/23/2018 04/14/2017  WBC 3.8 -  10.8 Thousand/uL 5.9 4.9 6.8  Hemoglobin 11.7 - 15.5 g/dL 10.0(L) 9.3(L) 11.6(L)  Hematocrit 35.0 - 45.0 % 32.1(L) 30.8(L) 35.6  Platelets 140 - 400 Thousand/uL 345 358 348   Lab Results  Component Value Date   IRON 21 (L) 07/15/2019   TIBC 446 07/15/2019   FERRITIN 5 (L) 07/15/2019   She is on B12 shots monthly, last was 1000 mcg 5 weeks ago.  Lab Results  Component Value Date   [id number] 07/15/2019     Current Medications:  Current Outpatient Medications on File Prior to Visit  Medication Sig Dispense Refill  . Cholecalciferol (VITAMIN D PO) Take 1 tablet daily by mouth.    . cyanocobalamin (,VITAMIN B-12,) 1000 MCG/ML injection Inject  1063mcg once weekly for 6-8 weeks, then once a month 30 mL 2  . Fexofenadine HCl (ALLEGRA PO) Take by mouth as needed.    . fluticasone (FLONASE) 50 MCG/ACT nasal spray Place 2 sprays into both nostrils daily. 16 g 3  . IRON-FA-B CMP-C-BIOT-PROBIOTIC PO Take by mouth daily.    . SYRINGE/NEEDLE, DISP, 1 ML (B-D SYRINGE/NEEDLE 1CC/25GX5/8) 25G X 5/8" 1 ML MISC 1 SQ injection once weekly 100 each 1  . Vitamin D, Ergocalciferol, (DRISDOL) 1.25 MG (50000 UNIT) CAPS capsule 1 pill 2 days a week for vitamin d deficiency 36 capsule 1  . B Complex-C-Folic Acid (B COMPLEX-VITAMIN C-FOLIC ACID) 1 MG tablet Take 1 tablet by mouth daily with breakfast. 90 tablet 4   No current facility-administered medications on file prior to visit.   Health Maintenance:   Immunization History  Administered Date(s) Administered  . Influenza Split 02/06/2015  . Influenza-Unspecified 01/05/2014  . Moderna Sars-Covid-2 Vaccination 06/28/2019, 07/26/2019  . PPD Test 03/21/2013, 03/24/2014  . Td 04/08/2003  . Tdap 03/24/2014    Tetanus: 2015 Pneumovax: N/A Prevnar 13:  N/A Flu vaccine: gets annually at work, last 01/2020 Shingrix: ask insurance about coverage  Covid 19: 2/2, 2021  LMP: No LMP recorded. Pap: ? April 2021, every 1 years PAP/neg HPV, [facility] Dr. [name], reports requested MGM: 01/30/2020 DEXA: N/A  Colonoscopy: 07/13/19 1 polyp, diverticula Dr. [name], 7 year recall  EGD: N/A  Last Dental Exam: Dr. [name] Last Eye Exam: [facility], contacts, last [place] VISIT  Patient Care Team: [name], MD as PCP - General (Internal Medicine) [name], FNP as Nurse Practitioner (Obstetrics and Gynecology) [name], MD as Consulting Physician (Dermatology) [name], MD as Referring Physician (Allergy and Immunology)  Medical History:  Past Medical History:  Diagnosis Date  . Allergy   . B12 deficiency anemia   . Cancer ([place])    melanoma   . PONV (postoperative nausea  and vomiting)    Allergies No Known Allergies  SURGICAL HISTORY She  has a past surgical history that includes Cesarean section; Tubal ligation; Melanoma excised; Wisdom tooth extraction; and Colonoscopy w/ polypectomy (2021).   FAMILY HISTORY Her family history includes Cancer in her maternal grandmother, paternal grandfather, and paternal grandmother; Colon polyps in her father and mother; Diabetes in her father; Heart disease in her maternal grandfather; Hypertension in her father and mother; Melanoma in her mother; Stroke in her mother.  Paternal Grandmother and Paternal aunt had menangiomas  SOCIAL HISTORY She  reports that she quit smoking about 22 years ago. Her smoking use included cigarettes. She has a 12.00 pack-year smoking history. She has never used smokeless tobacco. She reports current alcohol use of about 2.0 standard drinks of alcohol  per week. She reports that she does not use drugs.   Review of Systems: Review of Systems  Constitutional: Negative for malaise/fatigue and weight loss.  HENT: Negative for hearing loss and tinnitus.   Eyes: Negative for blurred vision and double vision.  Respiratory: Negative for cough, shortness of breath and wheezing.   Cardiovascular: Negative for chest pain, palpitations, orthopnea, claudication and leg swelling.  Gastrointestinal: Negative for abdominal pain, blood in stool, constipation, diarrhea, heartburn, melena, nausea and vomiting.  Genitourinary: Negative.   Musculoskeletal: Negative for joint pain and myalgias.  Skin: Negative for rash.  Neurological: Negative for dizziness, tingling, sensory change, weakness and headaches.  Endo/Heme/Allergies: Negative for polydipsia.  Psychiatric/Behavioral: Negative.   All other systems reviewed and are negative.   Physical Exam: Estimated body mass index is 27.99 kg/m as calculated from the following:   Height as of this encounter: 5\' 3"  (1.6 m).   Weight as of this encounter: 158  lb (71.7 kg). BP 118/74   Pulse 78   Temp (!) 97.5 F (36.4 C)   Ht 5\' 3"  (1.6 m)   Wt 158 lb (71.7 kg)   SpO2 98%   BMI 27.99 kg/m  General Appearance: Well nourished, in no apparent distress.  Eyes: PERRLA, EOMs, conjunctiva no swelling or erythema Sinuses: No Frontal/maxillary tenderness  ENT/Mouth: Ext aud canals clear, normal light reflex with TMs without erythema, bulging. Good dentition. No erythema, swelling, or exudate on post pharynx. Tonsils not swollen or erythematous. Hearing normal.  Neck: Supple, thyroid normal. No bruits  Respiratory: Respiratory effort normal, BS equal bilaterally without rales, rhonchi, wheezing or stridor.  Cardio: RRR without murmurs, rubs or gallops. Brisk peripheral pulses without edema.  Chest: symmetric, with normal excursions and percussion.  Breasts: defer to GYN Abdomen: Soft, nontender, no guarding, rebound, hernias, masses, or organomegaly.  Lymphatics: Non tender without lymphadenopathy.  Genitourinary: defer to GYN Musculoskeletal: Full ROM all peripheral extremities,5/5 strength, and normal gait.  Skin: Warm, dry without rashes, lesions, ecchymosis. Neuro: Cranial nerves intact, reflexes equal bilaterally. Normal muscle tone, no cerebellar symptoms. Sensation intact.  Psych: Awake and oriented X 3, normal affect, Insight and Judgment appropriate.   EKG: WNL no ST changes in 2021 reviewed, defer  Anna Hernandez 5:26 PM [facility]

## 2020-08-01 NOTE — Patient Instructions (Addendum)
Ms. Humphrey-Messer , Thank you for taking time to come for your Annual Wellness Visit. I appreciate your ongoing commitment to your health goals. Please review the following plan we discussed and let me know if I can assist you in the future.    This is a list of the screening recommended for you and due dates:  Health Maintenance  Topic Date Due  . COVID-19 Vaccine (3 - Moderna risk 4-dose series) 08/23/2019  . Flu Shot  11/05/2020  . Mammogram  01/29/2021  . Pap Smear  06/08/2021  . Tetanus Vaccine  03/24/2024  . Colon Cancer Screening  07/13/2026  . HPV Vaccine  Aged Out  .  Hepatitis C: One time screening is recommended by Center for Disease Control  (CDC) for  adults born from [birth year] through 1967.   Discontinued  . HIV Screening  Discontinued   Know what a healthy weight is for you (roughly BMI <25) and aim to maintain this  Aim for 7+ servings of fruits and vegetables daily  65-80+ fluid ounces of water or unsweet tea for healthy kidneys  Limit to max 1 drink of alcohol per day; avoid smoking/tobacco  Limit animal fats in diet for cholesterol and heart health - choose grass fed whenever available  Avoid highly processed foods, and foods high in saturated/trans fats  Aim for low stress - take time to unwind and care for your mental health  Aim for 150 min of moderate intensity exercise weekly for heart health, and weights twice weekly for bone health  Aim for 7-9 hours of sleep daily      Check with insurance if they cover shingrix - can get [facility] or [facility] 2-6 months     Zoster Vaccine, Recombinant injection What is this medicine? ZOSTER VACCINE (ZOS ter vak SEEN) is a vaccine used to reduce the risk of getting shingles. This vaccine is not used to treat shingles or nerve pain from shingles. This medicine may be used for other purposes; ask your health care provider or pharmacist if you have questions. COMMON BRAND NAME(S): [facility] What should I tell my  health care provider before I take this medicine? They need to know if you have any of these conditions:  cancer  immune system problems  an unusual or allergic reaction to Zoster vaccine, other medications, foods, dyes, or preservatives  pregnant or trying to get pregnant  breast-feeding How should I use this medicine? This vaccine is injected into a muscle. It is given by a health care provider. A copy of Vaccine Information Statements will be given before each vaccination. Be sure to read this information carefully each time. This sheet may change often. Talk to your health care provider about the use of this vaccine in children. This vaccine is not approved for use in children. Overdosage: If you think you have taken too much of this medicine contact a poison control center or emergency room at once. NOTE: This medicine is only for you. Do not share this medicine with others. What if I miss a dose? Keep appointments for follow-up (booster) doses. It is important not to miss your dose. Call your health care provider if you are unable to keep an appointment. What may interact with this medicine?  medicines that suppress your immune system  medicines to treat cancer  steroid medicines like prednisone or cortisone This list may not describe all possible interactions. Give your health care provider a list of all the medicines, herbs, non-prescription drugs, or  dietary supplements you use. Also tell them if you smoke, drink alcohol, or use illegal drugs. Some items may interact with your medicine. What should I watch for while using this medicine? Visit your health care provider regularly. This vaccine, like all vaccines, may not fully protect everyone. What side effects may I notice from receiving this medicine? Side effects that you should report to your doctor or health care professional as soon as possible:  allergic reactions (skin rash, itching or hives; swelling of the face,  lips, or tongue)  trouble breathing Side effects that usually do not require medical attention (report these to your doctor or health care professional if they continue or are bothersome):  chills  headache  fever  nausea  pain, redness, or irritation at site where injected  tiredness  vomiting This list may not describe all possible side effects. Call your doctor for medical advice about side effects. You may report side effects to FDA at 1-800-FDA-1088. Where should I keep my medicine? This vaccine is only given by a health care provider. It will not be stored at home. NOTE: This sheet is a summary. It may not cover all possible information. If you have questions about this medicine, talk to your doctor, pharmacist, or health care provider.  2021 Elsevier/Gold Standard (2019-04-29 16:23:07)

## 2020-08-02 ENCOUNTER — Other Ambulatory Visit: Payer: Self-pay | Admitting: Adult Health

## 2020-08-02 DIAGNOSIS — D509 Iron deficiency anemia, unspecified: Secondary | ICD-10-CM

## 2020-08-02 LAB — COMPLETE METABOLIC PANEL WITH GFR
AG Ratio: 1.5 (calc) (ref 1.0–2.5)
ALT: 11 U/L (ref 6–29)
AST: 13 U/L (ref 10–35)
Albumin: 4.3 g/dL (ref 3.6–5.1)
Alkaline phosphatase (APISO): 67 U/L (ref 37–153)
BUN: 12 mg/dL (ref 7–25)
CO2: 25 mmol/L (ref 20–32)
Calcium: 9.5 mg/dL (ref 8.6–10.4)
Chloride: 105 mmol/L (ref 98–110)
Creat: 0.64 mg/dL (ref 0.50–1.05)
GFR, Est African American: 120 mL/min/{1.73_m2} (ref >=60)
GFR, Est Non African American: 103 mL/min/{1.73_m2} (ref >=60)
Globulin: 2.9 g/dL (calc) (ref 1.9–3.7)
Glucose, Bld: 82 mg/dL (ref 65–99)
Potassium: 4 mmol/L (ref 3.5–5.3)
Sodium: 138 mmol/L (ref 135–146)
Total Bilirubin: 0.3 mg/dL (ref 0.2–1.2)
Total Protein: 7.2 g/dL (ref 6.1–8.1)

## 2020-08-02 LAB — LIPID PANEL
Cholesterol: 182 mg/dL (ref <200)
HDL: 58 mg/dL (ref >=50)
LDL Cholesterol (Calc): 102 mg/dL (calc) — ABNORMAL HIGH
Non-HDL Cholesterol (Calc): 124 mg/dL (calc) (ref <130)
Total CHOL/HDL Ratio: 3.1 (calc) (ref <5.0)
Triglycerides: 127 mg/dL (ref <150)

## 2020-08-02 LAB — VITAMIN D 25 HYDROXY (VIT D DEFICIENCY, FRACTURES): Vit D, 25-Hydroxy: 63 ng/mL (ref 30–100)

## 2020-08-02 LAB — CBC WITH DIFFERENTIAL/PLATELET
Absolute Monocytes: 562 cells/uL (ref 200–950)
Basophils Absolute: 30 cells/uL (ref 0–200)
Basophils Relative: 0.4 %
Eosinophils Absolute: 178 cells/uL (ref 15–500)
Eosinophils Relative: 2.4 %
HCT: 38.1 % (ref 35.0–45.0)
Hemoglobin: 12.3 g/dL (ref 11.7–15.5)
Lymphs Abs: 1857 cells/uL (ref 850–3900)
MCH: 28.4 pg (ref 27.0–33.0)
MCHC: 32.3 g/dL (ref 32.0–36.0)
MCV: 88 fL (ref 80.0–100.0)
MPV: 11.3 fL (ref 7.5–12.5)
Monocytes Relative: 7.6 %
Neutro Abs: 4773 cells/uL (ref 1500–7800)
Neutrophils Relative %: 64.5 %
Platelets: 321 10*3/uL (ref 140–400)
RBC: 4.33 10*6/uL (ref 3.80–5.10)
RDW: 15.7 % — ABNORMAL HIGH (ref 11.0–15.0)
Total Lymphocyte: 25.1 %
WBC: 7.4 10*3/uL (ref 3.8–10.8)

## 2020-08-02 LAB — URINALYSIS, ROUTINE W REFLEX MICROSCOPIC
Bilirubin Urine: NEGATIVE
Glucose, UA: NEGATIVE
Hgb urine dipstick: NEGATIVE
Leukocytes,Ua: NEGATIVE
Nitrite: NEGATIVE
Protein, ur: NEGATIVE
Specific Gravity, Urine: 1.02 (ref 1.001–1.035)
pH: 6 (ref 5.0–8.0)

## 2020-08-02 LAB — MAGNESIUM: Magnesium: 2.3 mg/dL (ref 1.5–2.5)

## 2020-08-02 LAB — TSH: TSH: 1.66 mIU/L

## 2020-08-02 LAB — IRON,TIBC AND FERRITIN PANEL
%SAT: 8 % (calc) — ABNORMAL LOW (ref 16–45)
Ferritin: 7 ng/mL — ABNORMAL LOW (ref 16–232)
Iron: 38 ug/dL — ABNORMAL LOW (ref 45–160)
TIBC: 476 mcg/dL (calc) — ABNORMAL HIGH (ref 250–450)

## 2020-08-02 LAB — HEMOGLOBIN A1C
Hgb A1c MFr Bld: 5.1 % of total Hgb (ref <5.7)
Mean Plasma Glucose: 100 mg/dL
eAG (mmol/L): 5.5 mmol/L

## 2020-08-02 LAB — VITAMIN B12: Vitamin B-12: 334 pg/mL (ref 200–1100)

## 2020-08-02 MED ORDER — CYANOCOBALAMIN 1000 MCG/ML IJ SOLN: INTRAMUSCULAR | 2 refills | Status: DC

## 2021-01-23 ENCOUNTER — Ambulatory Visit: Payer: 59 | Admitting: Family Medicine

## 2021-01-31 ENCOUNTER — Other Ambulatory Visit: Payer: Self-pay

## 2021-01-31 ENCOUNTER — Other Ambulatory Visit: Payer: 59

## 2021-01-31 DIAGNOSIS — D509 Iron deficiency anemia, unspecified: Secondary | ICD-10-CM

## 2021-02-01 ENCOUNTER — Other Ambulatory Visit: Payer: Self-pay | Admitting: Adult Health

## 2021-02-01 DIAGNOSIS — D509 Iron deficiency anemia, unspecified: Secondary | ICD-10-CM

## 2021-02-01 LAB — CBC WITH DIFFERENTIAL/PLATELET
Absolute Monocytes: 526 cells/uL (ref 200–950)
Basophils Absolute: 28 cells/uL (ref 0–200)
Basophils Relative: 0.5 %
Eosinophils Absolute: 101 cells/uL (ref 15–500)
Eosinophils Relative: 1.8 %
HCT: 34.8 % — ABNORMAL LOW (ref 35.0–45.0)
Hemoglobin: 10.9 g/dL — ABNORMAL LOW (ref 11.7–15.5)
Lymphs Abs: 1702 cells/uL (ref 850–3900)
MCH: 27.3 pg (ref 27.0–33.0)
MCHC: 31.3 g/dL — ABNORMAL LOW (ref 32.0–36.0)
MCV: 87 fL (ref 80.0–100.0)
MPV: 10.9 fL (ref 7.5–12.5)
Monocytes Relative: 9.4 %
Neutro Abs: 3242 cells/uL (ref 1500–7800)
Neutrophils Relative %: 57.9 %
Platelets: 315 10*3/uL (ref 140–400)
RBC: 4 10*6/uL (ref 3.80–5.10)
RDW: 14.1 % (ref 11.0–15.0)
Total Lymphocyte: 30.4 %
WBC: 5.6 10*3/uL (ref 3.8–10.8)

## 2021-02-01 LAB — IRON,TIBC AND FERRITIN PANEL
%SAT: 7 % (calc) — ABNORMAL LOW (ref 16–45)
Ferritin: 4 ng/mL — ABNORMAL LOW (ref 16–232)
Iron: 32 ug/dL — ABNORMAL LOW (ref 45–160)
TIBC: 460 mcg/dL (calc) — ABNORMAL HIGH (ref 250–450)

## 2021-02-19 LAB — HM MAMMOGRAPHY

## 2021-02-27 ENCOUNTER — Ambulatory Visit: Payer: 59 | Admitting: Obstetrics & Gynecology

## 2021-03-06 ENCOUNTER — Ambulatory Visit (INDEPENDENT_AMBULATORY_CARE_PROVIDER_SITE_OTHER): Payer: 59 | Admitting: Obstetrics & Gynecology

## 2021-03-06 ENCOUNTER — Encounter: Payer: Self-pay | Admitting: Obstetrics & Gynecology

## 2021-03-06 ENCOUNTER — Other Ambulatory Visit (HOSPITAL_COMMUNITY)
Admission: RE | Admit: 2021-03-06 | Discharge: 2021-03-06 | Disposition: A | Discharge status: Home / Self Care | Payer: 59 | Source: Ambulatory Visit | Attending: Obstetrics & Gynecology | Admitting: Obstetrics & Gynecology

## 2021-03-06 ENCOUNTER — Other Ambulatory Visit: Payer: Self-pay

## 2021-03-06 VITALS — BP 104/76 | HR 76 | Resp 16 | Ht 62.75 in | Wt 152.0 lb

## 2021-03-06 DIAGNOSIS — Z9851 Tubal ligation status: Secondary | ICD-10-CM | POA: Diagnosis not present

## 2021-03-06 DIAGNOSIS — N904 Leukoplakia of vulva: Secondary | ICD-10-CM | POA: Diagnosis not present

## 2021-03-06 DIAGNOSIS — N9089 Other specified noninflammatory disorders of vulva and perineum: Secondary | ICD-10-CM | POA: Diagnosis not present

## 2021-03-06 DIAGNOSIS — Z01419 Encounter for gynecological examination (general) (routine) without abnormal findings: Secondary | ICD-10-CM | POA: Insufficient documentation

## 2021-03-06 DIAGNOSIS — R350 Frequency of micturition: Secondary | ICD-10-CM | POA: Diagnosis not present

## 2021-03-06 LAB — URINALYSIS, COMPLETE W/RFL CULTURE
Bacteria, UA: NONE SEEN /HPF
Bilirubin Urine: NEGATIVE
Glucose, UA: NEGATIVE
Hgb urine dipstick: NEGATIVE
Hyaline Cast: NONE SEEN /LPF
Ketones, ur: NEGATIVE
Leukocyte Esterase: NEGATIVE
Nitrites, Initial: NEGATIVE
Protein, ur: NEGATIVE
RBC / HPF: NONE SEEN /HPF (ref 0–2)
Specific Gravity, Urine: 1.002 (ref 1.001–1.035)
WBC, UA: NONE SEEN /HPF (ref 0–5)
pH: 6 (ref 5.0–8.0)

## 2021-03-06 LAB — WET PREP FOR TRICH, YEAST, CLUE

## 2021-03-06 LAB — NO CULTURE INDICATED

## 2021-03-06 MED ORDER — CLOBETASOL PROPIONATE 0.05 % EX OINT: 1.0000 "application " | TOPICAL_OINTMENT | Freq: Two times a day (BID) | CUTANEOUS | 6 refills | Status: DC

## 2021-03-06 NOTE — Progress Notes (Signed)
Anna Hernandez Aug 25, 1970 [id number]   History:    50 y.o. G3P3L4 Married.  S/P TL.  Children twin 14 yo, 9 yo and 59 yo.   RP:  Established patient presenting for annual gyn exam and severe vulvar discomfort/pain   HPI: Menses light but every 3-4 weeks recently. No BTB.  Secondary anemia improved with Hb at 10.9 in 07/2020.  No pelvic pain.  No pain with IC.  Pap Neg 08/2019. C/O very severe vulvar irritation x many weeks.  Mild urinary frequency.  BMs normal.  Breasts normal.  Mammo 01/2021, will obtain report by fax.  Not exercising regularly.  BMI 27.14. Health labs with Fam MD. COLONOSCOPY: 07-13-19   Past medical history,surgical history, family history and social history were all reviewed and documented in the EPIC chart.  Gynecologic History Patient's last menstrual period was 02/24/2021 (exact date).  Obstetric History OB History  Gravida Para Term Preterm AB Living  3 3       4   SAB IAB Ectopic Multiple Live Births        1      # Outcome Date GA Lbr Len/2nd Weight Sex Delivery Anes PTL Lv  3 Para           2 Para           1 Para              ROS: A ROS was performed and pertinent positives and negatives are included in the history.  GENERAL: No fevers or chills. HEENT: No change in vision, no earache, sore throat or sinus congestion. NECK: No pain or stiffness. CARDIOVASCULAR: No chest pain or pressure. No palpitations. PULMONARY: No shortness of breath, cough or wheeze. GASTROINTESTINAL: No abdominal pain, nausea, vomiting or diarrhea, melena or bright red blood per rectum. GENITOURINARY: No urinary frequency, urgency, hesitancy or dysuria. MUSCULOSKELETAL: No joint or muscle pain, no back pain, no recent trauma. DERMATOLOGIC: No rash, no itching, no lesions. ENDOCRINE: No polyuria, polydipsia, no heat or cold intolerance. No recent change in weight. HEMATOLOGICAL: No anemia or easy bruising or bleeding. NEUROLOGIC: No headache, seizures, numbness, tingling or  weakness. PSYCHIATRIC: No depression, no loss of interest in normal activity or change in sleep pattern.     Exam:   BP 104/76   Pulse 76   Resp 16   Ht 5' 2.75" (1.594 m)   Wt 152 lb (68.9 kg)   LMP 02/24/2021 (Exact Date) Comment: btl  BMI 27.14 kg/m   Body mass index is 27.14 kg/m.  General appearance : Well developed well nourished female. No acute distress HEENT: Eyes: no retinal hemorrhage or exudates,  Neck supple, trachea midline, no carotid bruits, no thyroidmegaly Lungs: Clear to auscultation, no rhonchi or wheezes, or rib retractions  Heart: Regular rate and rhythm, no murmurs or gallops Breast:Examined in sitting and supine position were symmetrical in appearance, no palpable masses or tenderness,  no skin retraction, no nipple inversion, no nipple discharge, no skin discoloration, no axillary or supraclavicular lymphadenopathy Abdomen: no palpable masses or tenderness, no rebound or guarding Extremities: no edema or skin discoloration or tenderness  Pelvic: Vulva: White atrophy with inflammation Rt > Lt vulva with 2 ulcers on the Rt vulva.             Vagina: No gross lesions or discharge  Cervix: No gross lesions or discharge  Uterus  AV, normal size, shape and consistency, non-tender and mobile  Adnexa  Without masses or tenderness  Anus: Normal  U/A Neg Wet prep Neg   Assessment/Plan:  50 y.o. female for annual exam   1. Encounter for routine gynecological examination with Papanicolaou smear of cervix Menses light but every 3-4 weeks recently. No BTB.  Secondary anemia improved with Hb at 10.9 in 07/2020.  No pelvic pain.  No pain with IC.  Pap Neg 08/2019. Pap reflex done today.  C/O very severe vulvar irritation x many weeks.  Mild urinary frequency.  BMs normal.  Breasts normal.  Mammo 01/2021, will obtain report by fax.  Not exercising regularly.  BMI 27.14. Health labs with Fam MD.  COLONOSCOPY: 07-13-19  - Cytology - PAP( [place])  2. S/P tubal  ligation  3. Vulvar irritation C/O very severe vulvar irritation x many weeks. Vulvar exam c/w Lichen Sclerosus.  Possibly Lichen Planus.  Patient informed of findings.  Decision to start on Clobetasol ointment.  Usage/Benefits and Risks thoroughly reviewed.  Will f/u at 4 weeks to reevaluate the Vulva.  Vulvar Bx at that time per findings.  Wet prep Neg. - WET PREP FOR [place], YEAST, CLUE  4. Lichen sclerosus et atrophicus of the vulva C/O very severe vulvar irritation x many weeks. Vulvar exam c/w Lichen Sclerosus.  Possibly Lichen Planus.  Patient informed of findings.  Decision to start on Clobetasol ointment.  Usage/Benefits and Risks thoroughly reviewed.  Will f/u at 4 weeks to reevaluate the Vulva.  Vulvar Bx at that time per findings.  5. Urinary frequency U/A Neg. - Urinalysis,Complete w/RFL Culture  Other orders - REFLEXIVE URINE CULTURE - clobetasol ointment (TEMOVATE) 0.05 %; Apply 1 application topically 2 (two) times daily. Thin Vulvar application twice a day x 1 week, then daily x 1 week, then twice a week long term.   Princess Bruins MD, 4:39 PM 03/06/2021

## 2021-03-07 ENCOUNTER — Encounter: Payer: Self-pay | Admitting: Obstetrics & Gynecology

## 2021-03-12 LAB — CYTOLOGY - PAP
Chlamydia: NEGATIVE
Comment: NEGATIVE
Comment: NEGATIVE
Comment: NORMAL
Diagnosis: NEGATIVE
Diagnosis: REACTIVE
High risk HPV: NEGATIVE
Neisseria Gonorrhea: NEGATIVE

## 2021-04-09 ENCOUNTER — Encounter: Payer: Self-pay | Admitting: Obstetrics & Gynecology

## 2021-04-09 ENCOUNTER — Ambulatory Visit (INDEPENDENT_AMBULATORY_CARE_PROVIDER_SITE_OTHER): Payer: 59 | Admitting: Obstetrics & Gynecology

## 2021-04-09 ENCOUNTER — Other Ambulatory Visit: Payer: Self-pay

## 2021-04-09 VITALS — BP 120/70

## 2021-04-09 DIAGNOSIS — N904 Leukoplakia of vulva: Secondary | ICD-10-CM

## 2021-04-09 NOTE — Progress Notes (Signed)
° ° °  Anna Hernandez [birth year] [id number]        51 y.o.  G3P3L4 Married   RP: F/U of Vulvar irritation secondary to Lichen Sclerosus  HPI: Much improved on Clobetasol ointment once to twice a week x 03/06/2021.  No itchiness, discomfort or pain anymore.   OB History  Gravida Para Term Preterm AB Living  3 3       4   SAB IAB Ectopic Multiple Live Births        1      # Outcome Date GA Lbr Len/2nd Weight Sex Delivery Anes PTL Lv  3 Para           2 Para           1 Para             Past medical history,surgical history, problem list, medications, allergies, family history and social history were all reviewed and documented in the EPIC chart.   Directed ROS with pertinent positives and negatives documented in the history of present illness/assessment and plan.  Exam:  Vitals:   04/09/21 1017  BP: 120/70   General appearance:  Normal  Gynecologic exam: Vulva:  Acute inflammation resolved.  Mild white atrophy at outer labia minora bilaterally.  No erythema.   Assessment/Plan:  51 y.o. G3P3   1. Lichen sclerosus et atrophicus of the vulva  Excellent response to Clobetasol ointment.  Recommend to continue vulvar application once to twice a week.    Princess Bruins MD, 10:36 AM 04/09/2021

## 2021-04-29 ENCOUNTER — Encounter: Payer: Self-pay | Admitting: Internal Medicine

## 2021-05-23 IMAGING — CR DG SHOULDER 2+V*L*
3 series · 3 of 3 positions shown · non-contrast
Comparison: None.

CLINICAL DATA: Atraumatic left shoulder pain x2 weeks.

EXAM:
LEFT SHOULDER - 2+ VIEW

[w shoulder grashey left]
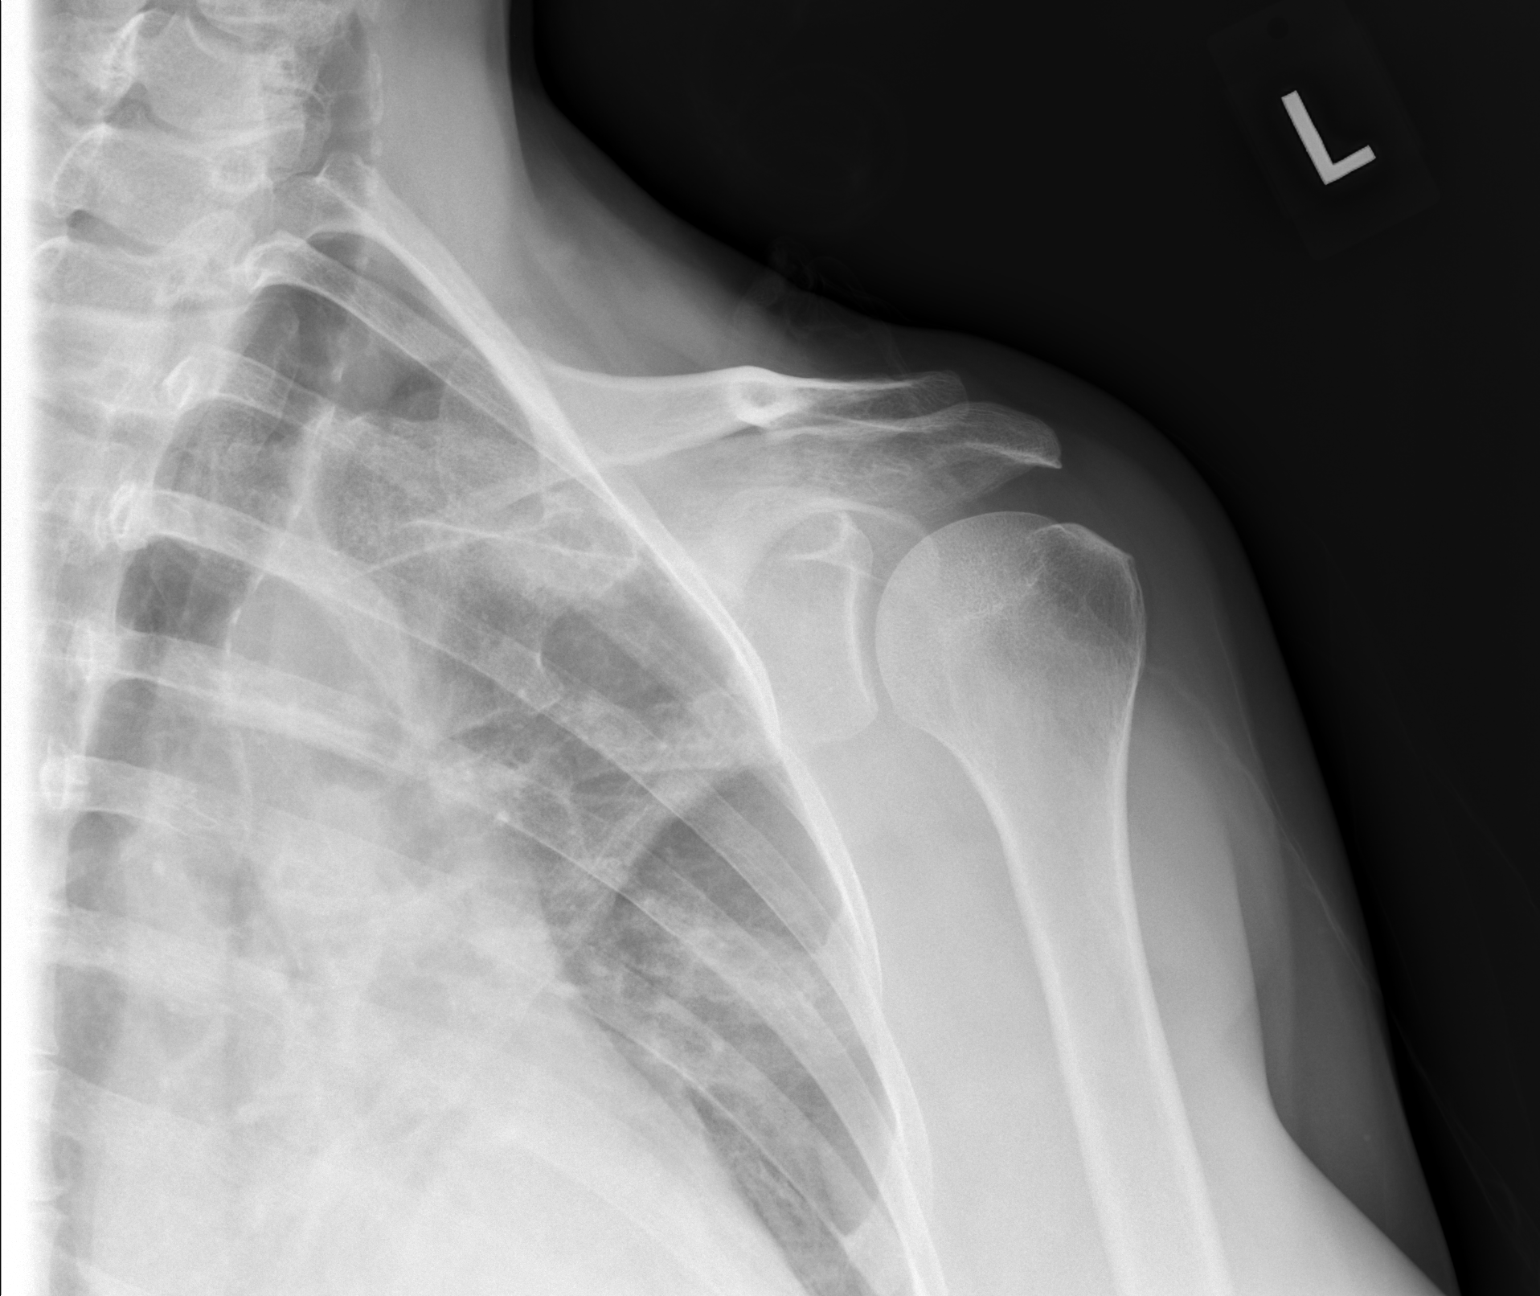

[w shoulder y-view left]
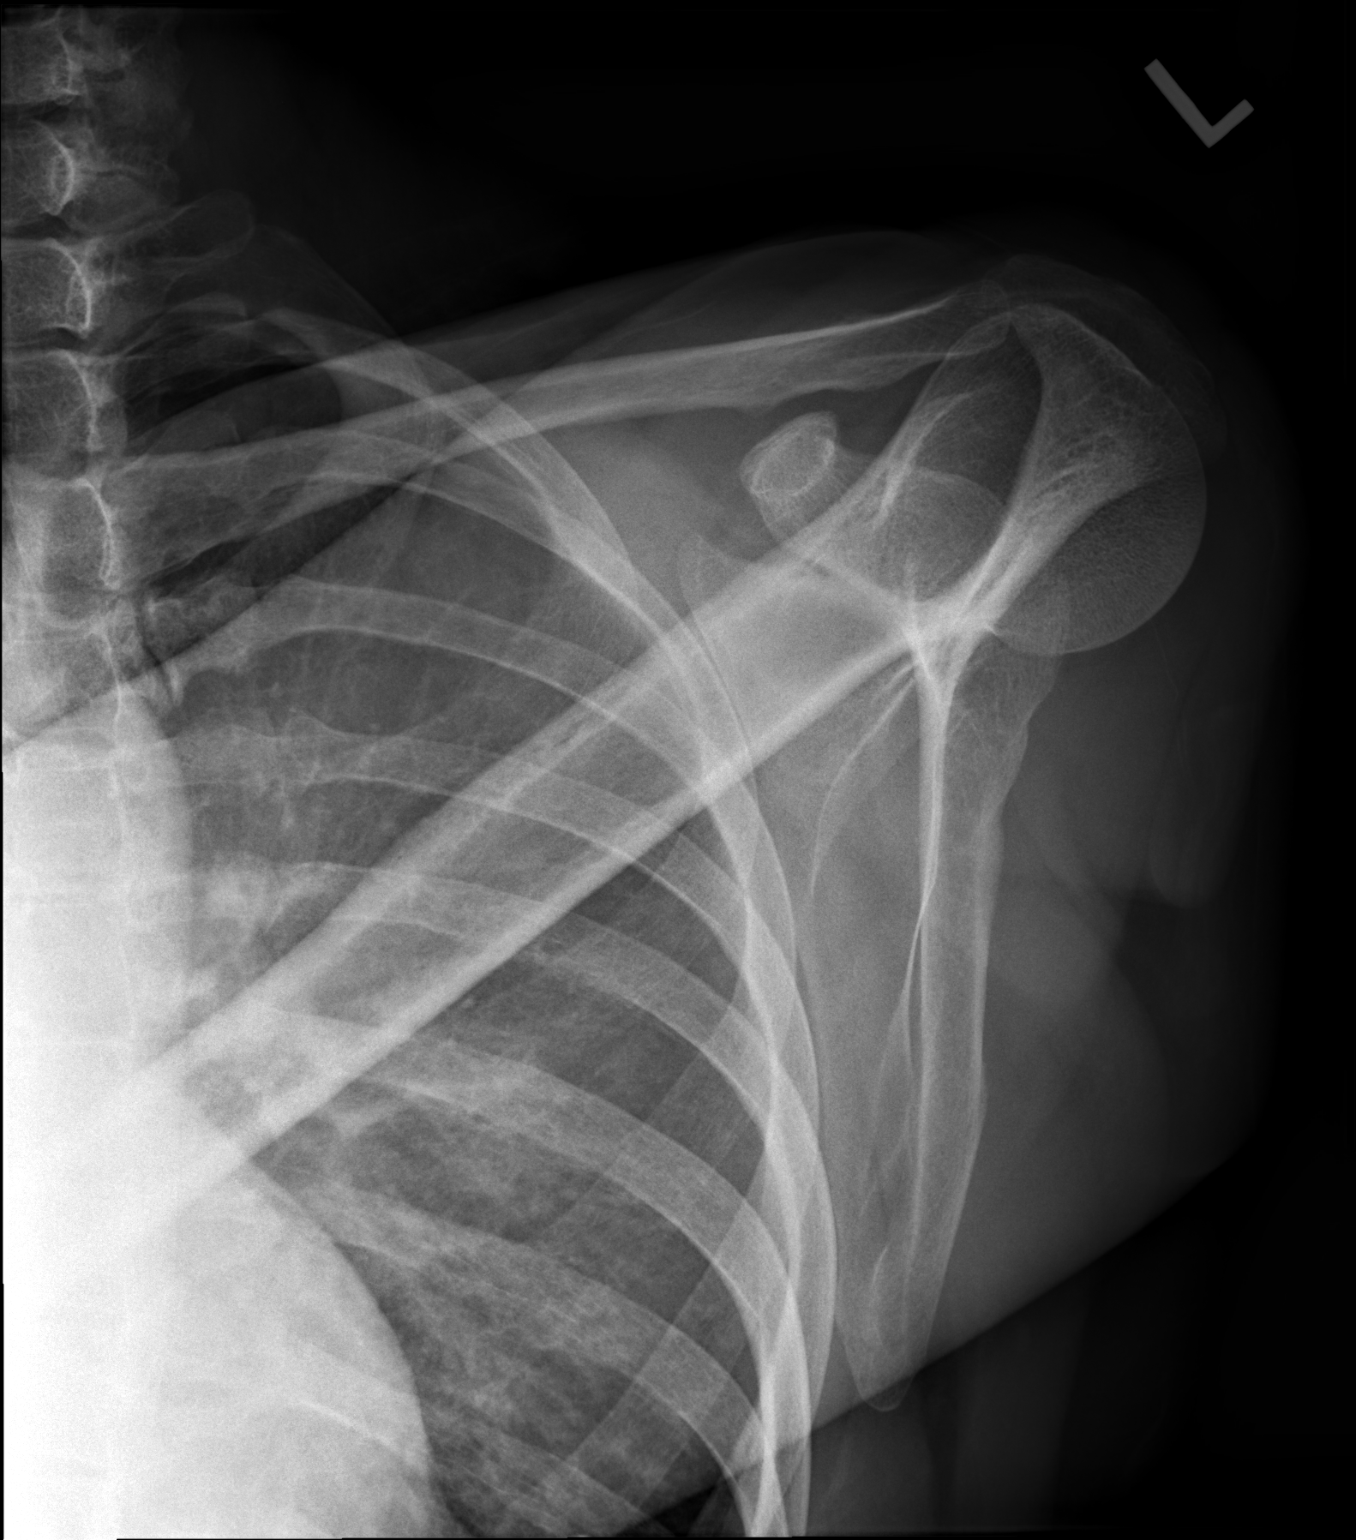

[x shoulder axillary left]
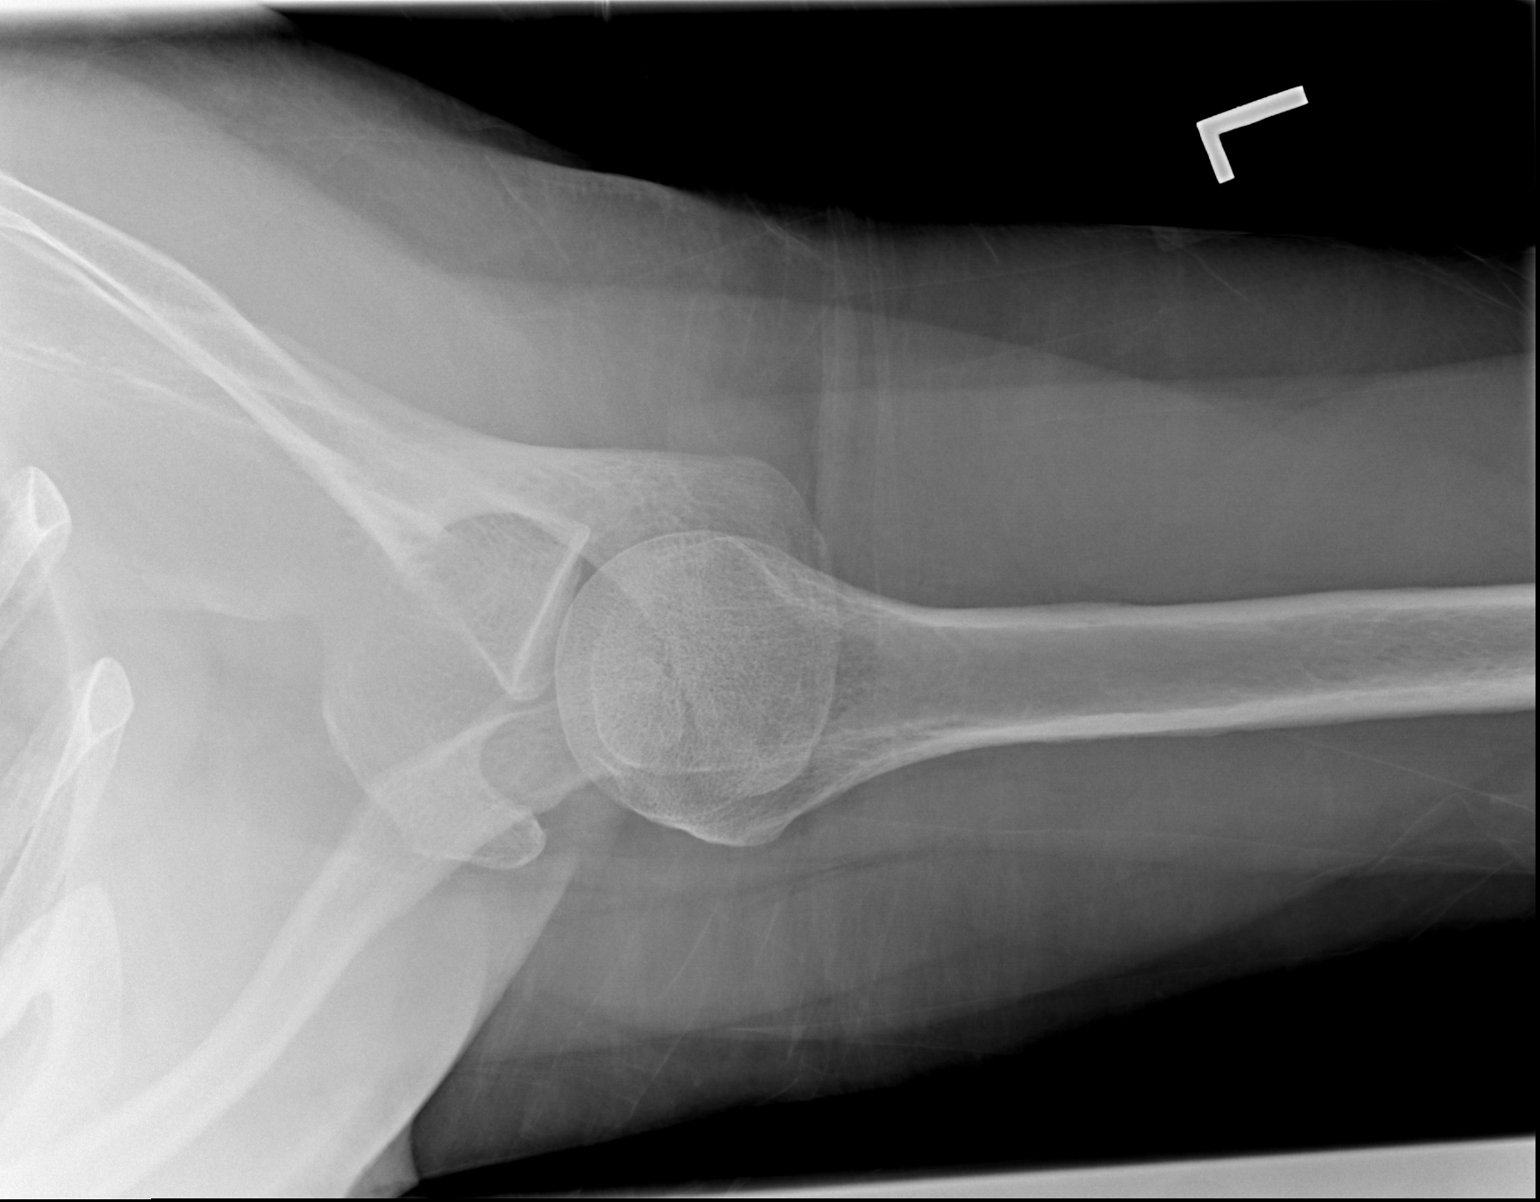

[3 of 3 positions shown; findings below may reference images not displayed]

FINDINGS: There is no evidence of fracture or dislocation. There is no
evidence of arthropathy or other focal bone abnormality. Soft
tissues are unremarkable.
IMPRESSION: Negative.

## 2021-06-12 ENCOUNTER — Other Ambulatory Visit: Payer: Self-pay | Admitting: Adult Health Nurse Practitioner

## 2021-08-01 ENCOUNTER — Encounter: Payer: Self-pay | Admitting: Adult Health

## 2021-08-01 ENCOUNTER — Ambulatory Visit (INDEPENDENT_AMBULATORY_CARE_PROVIDER_SITE_OTHER): Payer: 59 | Admitting: Adult Health

## 2021-08-01 VITALS — BP 108/76 | HR 88 | Temp 97.5°F | Ht 63.5 in | Wt 157.0 lb

## 2021-08-01 DIAGNOSIS — E559 Vitamin D deficiency, unspecified: Secondary | ICD-10-CM

## 2021-08-01 DIAGNOSIS — E538 Deficiency of other specified B group vitamins: Secondary | ICD-10-CM

## 2021-08-01 DIAGNOSIS — Z0000 Encounter for general adult medical examination without abnormal findings: Secondary | ICD-10-CM | POA: Diagnosis not present

## 2021-08-01 DIAGNOSIS — K5900 Constipation, unspecified: Secondary | ICD-10-CM | POA: Insufficient documentation

## 2021-08-01 DIAGNOSIS — Z0001 Encounter for general adult medical examination with abnormal findings: Secondary | ICD-10-CM

## 2021-08-01 DIAGNOSIS — R5383 Other fatigue: Secondary | ICD-10-CM

## 2021-08-01 DIAGNOSIS — Z833 Family history of diabetes mellitus: Secondary | ICD-10-CM

## 2021-08-01 DIAGNOSIS — D509 Iron deficiency anemia, unspecified: Secondary | ICD-10-CM

## 2021-08-01 DIAGNOSIS — Z136 Encounter for screening for cardiovascular disorders: Secondary | ICD-10-CM

## 2021-08-01 DIAGNOSIS — Z1389 Encounter for screening for other disorder: Secondary | ICD-10-CM

## 2021-08-01 DIAGNOSIS — C4360 Malignant melanoma of unspecified upper limb, including shoulder: Secondary | ICD-10-CM

## 2021-08-01 DIAGNOSIS — Z86010 Personal history of colonic polyps: Secondary | ICD-10-CM

## 2021-08-01 DIAGNOSIS — Z131 Encounter for screening for diabetes mellitus: Secondary | ICD-10-CM

## 2021-08-01 DIAGNOSIS — Z1329 Encounter for screening for other suspected endocrine disorder: Secondary | ICD-10-CM

## 2021-08-01 DIAGNOSIS — I10 Essential (primary) hypertension: Secondary | ICD-10-CM | POA: Diagnosis not present

## 2021-08-01 DIAGNOSIS — M7918 Myalgia, other site: Secondary | ICD-10-CM

## 2021-08-01 DIAGNOSIS — Z79899 Other long term (current) drug therapy: Secondary | ICD-10-CM

## 2021-08-01 DIAGNOSIS — R82998 Other abnormal findings in urine: Secondary | ICD-10-CM

## 2021-08-01 DIAGNOSIS — E663 Overweight: Secondary | ICD-10-CM

## 2021-08-01 DIAGNOSIS — E785 Hyperlipidemia, unspecified: Secondary | ICD-10-CM

## 2021-08-01 NOTE — Progress Notes (Signed)
Complete Physical ? ?Assessment and Plan: ? ?Encounter for Annual Physical Exam with abnormal findings ?Due annually  ?Health Maintenance reviewed ?Healthy lifestyle reviewed and goals set ?- get shingrix at pharmacy  ? ?Malignant melanoma of upper extremity, including shoulder, unspecified laterality ([place]) ?Follows with derm annually, no concerning areas today ? ?Calcium oxalate crystals in urine ?Monitor, push fluids ? ?Allergic rhinitis, unspecified seasonality, unspecified trigger ?- Allegra OTC, increase H20, allergy hygiene explained. ? ?Medication management ?-     CBC with Differential/Platelet ?-     COMPLETE METABOLIC PANEL WITH GFR ?-     TSH ?-     Magnesium ? ?Vitamin D deficiency ?-     VITAMIN D 25 Hydroxy (Vit-D Deficiency, Fractures) ? ?Hyperlipidemia ?Mild elevations managed by lifestyle ?Continue low cholesterol diet and exercise.  ?Check lipid panel.  ?-     Lipid panel ?-     TSH ? ?Screening for blood or protein in urine ?-     Urinalysis, Routine w reflex microscopic ? ?Screening diabetes/family history of diabetes ?     -      A1C ? ?Iron/b12 def, anemia ?-     CBC ?-     Iron, Total/Total Iron Binding Cap ?-     Ferritin ?-     Vitamin B12 ? ?Constipation ?- Increase fiber/ water intake, decrease caffeine, increase activity level. ?- follow up if not improving, suggest miralax ?- Please go to the hospital if you have severe abdominal pain, vomiting, fever, CP, SOB.  ? ?Fatigue/mental fog ?- Check CBC, CMP, TSH, B12, iron panel  ?- could be perimenopausal sx; monitor, encouraged lifestyle mods, increase fruit/veggie intake (fiber) ? ?Gluteal pain ?Intermittent but persistent nearly a year without injury, exam benign, check xr to r/o underlying SI issue, otherwise suggest stretches, position modification, NSAID PRN ? ?Orders Placed This Encounter  ?Procedures  ? DG Pelvis 1-2 Views  ? CBC with Differential/Platelet  ? COMPLETE METABOLIC PANEL WITH GFR  ? Magnesium  ? TSH  ? Lipid panel  ?  VITAMIN D 25 Hydroxy (Vit-D Deficiency, Fractures)  ? Vitamin B12  ? Iron, TIBC and Ferritin Panel  ? Urinalysis, Routine w reflex microscopic  ? Hemoglobin A1c  ? EKG 12-Lead  ? ? ? ?Discussed med's effects and SE's. Screening labs and tests as requested with regular follow-up as recommended. ?Over 40 minutes of exam, counseling, chart review, and complex, high level critical decision making was performed this visit.  ?Future Appointments  ?Date Time Provider [place]  ?03/12/2022  3:00 PM Princess Bruins, MD [facility] None  ?08/07/2022  2:00 PM [place], Kenney Houseman, NP [facility] None  ? ? ?HPI  ?51 y.o. female  presents for a complete physical. She has Melanoma ([place]); Allergic rhinitis; Calcium oxalate crystals in urine; Overweight (BMI 25.0-29.9); History of adenomatous polyp of colon; Iron deficiency anemia; B12 deficiency; and Hyperlipidemia on their problem list.  ? ?She is married, 4 kids, 60 oldest, 91 y/o twins. 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.  ? ?She does follow with GYN, has had tubes tied, still has cycles. Get mammograms annually at [facility], last 02/19/2021, did have a cyst per [place]. Having some mental fog, fatigue, irregular cycles.  ? ?She follows with [facility] derm annually, had melanoma removed left shoulder 2013 and had BCC to temple.  ? ?She reports persistent intermittent R gluteal tenderness, no pattern or injury.  ? ?BMI is Body mass index is 27.38 kg/m?., she has been working on diet and  exercise, does some intermittent fasting.  ?Water - trying to increase, currently working up to 65+  ?Admits 1-2 servings of veggies/day, ? Low fiber ?2 cups of coffee. ?Wt Readings from Last 3 Encounters:  ?08/01/21 157 lb (71.2 kg)  ?03/06/21 152 lb (68.9 kg)  ?08/01/20 158 lb (71.7 kg)  ? ?Her blood pressure has been controlled at home, today their BP is BP: 108/76  ?She does workout, 30 mins workout, walking, yoga.  She denies chest pain, shortness of breath, dizziness.  ? ?She is not on  cholesterol medication and denies myalgias. Her cholesterol is not at goal. The cholesterol last visit was:   ?Lab Results  ?Component Value Date  ? CHOL 182 08/01/2020  ? HDL 58 08/01/2020  ? LDLCALC 102 (H) 08/01/2020  ? TRIG 127 08/01/2020  ? CHOLHDL 3.1 08/01/2020  ?  ?Last A1C in the office was:  ?Lab Results  ?Component Value Date  ? HGBA1C 5.1 08/01/2020  ? ?Patient is on Vitamin D supplement, has been taking 50000 IU twice weekly  ?Lab Results  ?Component Value Date  ? VD25OH 63 08/01/2020  ? ?She has had anemia with iron and B12 deficiency ?Had colonoscopy 07/13/2019 with 1 polyp, diverticula, 7 year recall by Dr. [name] ? ?  Latest Ref Rng & Units 01/31/2021  ?  3:37 PM 08/01/2020  ?  3:59 PM 07/15/2019  ? 11:36 AM  ?CBC  ?WBC 3.8 - 10.8 Thousand/uL 5.6   7.4   5.9    ?Hemoglobin 11.7 - 15.5 g/dL 10.9   12.3   10.0    ?Hematocrit 35.0 - 45.0 % 34.8   38.1   32.1    ?Platelets 140 - 400 Thousand/uL 315   321   345    ? ?Taking daily iron supplement - low dose, 4.2 mg iron fumarate ?Lab Results  ?Component Value Date  ? IRON 32 (L) 01/31/2021  ? TIBC 460 (H) 01/31/2021  ? FERRITIN 4 (L) 01/31/2021  ? ?She is on B12 shots but not consistently, last was over 6 months ago  ?Lab Results  ?Component Value Date  ? [id number] 334 08/01/2020  ? ? ? ?Current Medications:  ?Current Outpatient Medications on File Prior to Visit  ?Medication Sig Dispense Refill  ? clobetasol ointment (TEMOVATE) 4.56 % Apply 1 application topically 2 (two) times daily. Thin Vulvar application twice a day x 1 week, then daily x 1 week, then twice a week long term. 30 g 6  ? cyanocobalamin (,VITAMIN B-12,) 1000 MCG/ML injection Inject 1071mg once a month 30 mL 2  ? Fexofenadine HCl (ALLEGRA PO) Take by mouth as needed.    ? fluticasone (FLONASE) 50 MCG/ACT nasal spray Place 2 sprays into both nostrils daily. 16 g 3  ? IRON-FA-B CMP-C-BIOT-PROBIOTIC PO Take by mouth daily.    ? SYRINGE/NEEDLE, DISP, 1 ML (B-D SYRINGE/NEEDLE 1CC/25GX5/8) 25G X  5/8" 1 ML MISC 1 SQ injection once weekly 100 each 1  ? Vitamin D, Ergocalciferol, (DRISDOL) 1.25 MG (50000 UNIT) CAPS capsule TAKE 1 CAPSULE BY MOUTH TWICE A WEEK FOR VITD DEFICIENCY 36 capsule 1  ? ?No current facility-administered medications on file prior to visit.  ? ?Health Maintenance:   ?Immunization History  ?Administered Date(s) Administered  ? Influenza Split 02/06/2015  ? Influenza-Unspecified 01/05/2014  ? Moderna Sars-Covid-2 Vaccination 06/28/2019, 07/26/2019  ? PPD Test 03/21/2013, 03/24/2014  ? Td 04/08/2003  ? Tdap 03/24/2014  ? ?Health Maintenance  ?Topic Date Due  ? Zoster Vaccines- Shingrix (  1 of 2) Never done  ? COVID-19 Vaccine (3 - Moderna risk series) 08/23/2019  ? INFLUENZA VACCINE  11/05/2021  ? MAMMOGRAM  02/19/2022  ? PAP SMEAR-Modifier  03/06/2024  ? TETANUS/TDAP  03/24/2024  ? COLONOSCOPY (Pts 45-47yr Insurance coverage will need to be confirmed)  07/13/2026  ? HPV VACCINES  Aged Out  ? Hepatitis C Screening  Discontinued  ? HIV Screening  Discontinued  ? ?Flu vaccine: gets annually at work ?Shingrix: ask insurance about coverage  ? ?LMP: Patient's last menstrual period was 08/01/2021. ?Pap: 03/06/2021, every 1 years PAP/neg HPV, ASCUS Dr. LPola Corn?MGM: 02/19/2021 [employer] ?DEXA: N/A ? ?Colonoscopy: 07/13/19 1 polyp, diverticula Dr. BTarri Glenn 7 year recall  ?EGD: N/A ? ?Last Dental Exam: Dr. SVanessa Kick?Last Eye Exam: [facility], contacts, last 2022 ?DERM ANNUAL VISIT ? ? ?Patient Care Team: ?MUnk Pinto MD as PCP - General (Internal Medicine) ?YMordecai Rasmussen FNP as Nurse Practitioner (Obstetrics and Gynecology) ?LRolm Bookbinder MD as Consulting Physician (Dermatology) ?WTiajuana Amass MD as Referring Physician (Allergy and Immunology) ? ?Medical History:  ?Past Medical History:  ?Diagnosis Date  ? Allergy   ? B12 deficiency anemia   ? Cancer ([facility]   ? melanoma   ? PONV (postoperative nausea and vomiting)   ? ?Allergies ?No Known Allergies ? ?SURGICAL HISTORY ?She  has a past surgical  history that includes Cesarean section; Tubal ligation; Melanoma excised; Wisdom tooth extraction; and Colonoscopy w/ polypectomy (2021).  ? ?FAMILY HISTORY ?Her family history includes Cancer in her maternal grandmot

## 2021-08-02 LAB — LIPID PANEL
Cholesterol: 183 mg/dL (ref <200)
HDL: 50 mg/dL (ref >=50)
LDL Cholesterol (Calc): 105 mg/dL (calc) — ABNORMAL HIGH
Non-HDL Cholesterol (Calc): 133 mg/dL (calc) — ABNORMAL HIGH (ref <130)
Total CHOL/HDL Ratio: 3.7 (calc) (ref <5.0)
Triglycerides: 162 mg/dL — ABNORMAL HIGH (ref <150)

## 2021-08-02 LAB — URINALYSIS, ROUTINE W REFLEX MICROSCOPIC
Bilirubin Urine: NEGATIVE
Glucose, UA: NEGATIVE
Hgb urine dipstick: NEGATIVE
Ketones, ur: NEGATIVE
Leukocytes,Ua: NEGATIVE
Nitrite: NEGATIVE
Protein, ur: NEGATIVE
Specific Gravity, Urine: 1.012 (ref 1.001–1.035)
pH: 6 (ref 5.0–8.0)

## 2021-08-02 LAB — IRON,TIBC AND FERRITIN PANEL
%SAT: 14 % (calc) — ABNORMAL LOW (ref 16–45)
Ferritin: 13 ng/mL — ABNORMAL LOW (ref 16–232)
Iron: 61 ug/dL (ref 45–160)
TIBC: 437 mcg/dL (calc) (ref 250–450)

## 2021-08-02 LAB — COMPLETE METABOLIC PANEL WITH GFR
AG Ratio: 1.5 (calc) (ref 1.0–2.5)
ALT: 9 U/L (ref 6–29)
AST: 11 U/L (ref 10–35)
Albumin: 4.2 g/dL (ref 3.6–5.1)
Alkaline phosphatase (APISO): 71 U/L (ref 37–153)
BUN: 13 mg/dL (ref 7–25)
CO2: 25 mmol/L (ref 20–32)
Calcium: 9.1 mg/dL (ref 8.6–10.4)
Chloride: 106 mmol/L (ref 98–110)
Creat: 0.71 mg/dL (ref 0.50–1.03)
Globulin: 2.8 g/dL (calc) (ref 1.9–3.7)
Glucose, Bld: 91 mg/dL (ref 65–99)
Potassium: 4 mmol/L (ref 3.5–5.3)
Sodium: 138 mmol/L (ref 135–146)
Total Bilirubin: 0.4 mg/dL (ref 0.2–1.2)
Total Protein: 7 g/dL (ref 6.1–8.1)
eGFR: 102 mL/min/{1.73_m2} (ref >=60)

## 2021-08-02 LAB — CBC WITH DIFFERENTIAL/PLATELET
Absolute Monocytes: 583 cells/uL (ref 200–950)
Basophils Absolute: 26 cells/uL (ref 0–200)
Basophils Relative: 0.3 %
Eosinophils Absolute: 70 cells/uL (ref 15–500)
Eosinophils Relative: 0.8 %
HCT: 36.8 % (ref 35.0–45.0)
Hemoglobin: 12.3 g/dL (ref 11.7–15.5)
Lymphs Abs: 1566 cells/uL (ref 850–3900)
MCH: 30.1 pg (ref 27.0–33.0)
MCHC: 33.4 g/dL (ref 32.0–36.0)
MCV: 90.2 fL (ref 80.0–100.0)
MPV: 11 fL (ref 7.5–12.5)
Monocytes Relative: 6.7 %
Neutro Abs: 6455 cells/uL (ref 1500–7800)
Neutrophils Relative %: 74.2 %
Platelets: 315 10*3/uL (ref 140–400)
RBC: 4.08 10*6/uL (ref 3.80–5.10)
RDW: 13.6 % (ref 11.0–15.0)
Total Lymphocyte: 18 %
WBC: 8.7 10*3/uL (ref 3.8–10.8)

## 2021-08-02 LAB — HEMOGLOBIN A1C
Hgb A1c MFr Bld: 5.2 % of total Hgb (ref <5.7)
Mean Plasma Glucose: 103 mg/dL
eAG (mmol/L): 5.7 mmol/L

## 2021-08-02 LAB — TSH: TSH: 0.89 mIU/L

## 2021-08-02 LAB — VITAMIN B12: Vitamin B-12: 304 pg/mL (ref 200–1100)

## 2021-08-02 LAB — MAGNESIUM: Magnesium: 2.2 mg/dL (ref 1.5–2.5)

## 2021-08-02 LAB — VITAMIN D 25 HYDROXY (VIT D DEFICIENCY, FRACTURES): Vit D, 25-Hydroxy: 88 ng/mL (ref 30–100)

## 2021-08-22 ENCOUNTER — Ambulatory Visit: Payer: 59 | Admitting: Nurse Practitioner

## 2022-02-12 ENCOUNTER — Encounter: Payer: Self-pay | Admitting: Obstetrics & Gynecology

## 2022-03-12 ENCOUNTER — Ambulatory Visit: Payer: 59 | Admitting: Obstetrics & Gynecology

## 2022-03-24 ENCOUNTER — Encounter: Payer: Self-pay | Admitting: Obstetrics & Gynecology

## 2022-04-04 ENCOUNTER — Ambulatory Visit: Payer: 59 | Admitting: Obstetrics & Gynecology

## 2022-04-14 ENCOUNTER — Ambulatory Visit: Payer: 59 | Admitting: Obstetrics & Gynecology

## 2022-04-30 ENCOUNTER — Ambulatory Visit (INDEPENDENT_AMBULATORY_CARE_PROVIDER_SITE_OTHER): Payer: 59 | Admitting: Nurse Practitioner

## 2022-04-30 ENCOUNTER — Other Ambulatory Visit: Payer: Self-pay

## 2022-04-30 VITALS — BP 138/80 | HR 92 | Temp 97.9°F | Ht 63.0 in | Wt 159.6 lb

## 2022-04-30 DIAGNOSIS — J019 Acute sinusitis, unspecified: Secondary | ICD-10-CM

## 2022-04-30 DIAGNOSIS — Z1152 Encounter for screening for COVID-19: Secondary | ICD-10-CM | POA: Diagnosis not present

## 2022-04-30 DIAGNOSIS — G44209 Tension-type headache, unspecified, not intractable: Secondary | ICD-10-CM

## 2022-04-30 DIAGNOSIS — R6889 Other general symptoms and signs: Secondary | ICD-10-CM | POA: Diagnosis not present

## 2022-04-30 LAB — POC COVID19 BINAXNOW: SARS Coronavirus 2 Ag: NEGATIVE

## 2022-04-30 LAB — POCT INFLUENZA A/B
Influenza A, POC: NEGATIVE
Influenza B, POC: NEGATIVE

## 2022-04-30 MED ORDER — AZITHROMYCIN 500 MG PO TABS: 500.0000 mg | ORAL_TABLET | Freq: Every day | ORAL | 0 refills | Status: AC

## 2022-04-30 MED ORDER — PREDNISONE 10 MG PO TABS: ORAL_TABLET | ORAL | 0 refills | Status: DC

## 2022-04-30 NOTE — Progress Notes (Unsigned)
Assessment and Plan:  Dacoda Spallone Humphrey-Messer was seen today for an episodic visit.  Diagnoses and all order for this visit:  1. Flu-like symptoms Negative  - POCT Influenza A/B  2. Encounter for screening for COVID-19 Negative - POC COVID-19  3. Acute non-recurrent sinusitis, unspecified location Consideringn >10 days on symptom onset will start tmt with abx and steroid taper. Stay well hydrated to keep mucus thin and productive.  - azithromycin (ZITHROMAX) 500 MG tablet; Take 1 tablet (500 mg total) by mouth daily for 10 days.  Dispense: 10 tablet; Refill: 0 - predniSONE (DELTASONE) 10 MG tablet; 1 tab 3 x day for 2 days, then 1 tab 2 x day for 2 days, then 1 tab 1 x day for 3 days  Dispense: 13 tablet; Refill: 0  4. Acute non intractable tension-type headache OTC Tylenol as needed as directed.  Notify office for further evaluation and treatment, questions or concerns if s/s fail to improve. The risks and benefits of my recommendations, as well as other treatment options were discussed with the patient today. Questions were answered.  Further disposition pending results of labs. Discussed med's effects and SE's.    Over 15 minutes of exam, counseling, chart review, and critical decision making was performed.   Future Appointments  Date Time Provider [place]  06/05/2022  2:30 PM Princess Bruins, MD [facility] None  08/07/2022  2:00 PM Daylynn Stumpp, Kenney Houseman, NP [facility] None    ------------------------------------------------------------------------------------------------------------------   HPI BP 138/80   Pulse 92   Temp 97.9 F (36.6 C)   Ht '5\' 3"'$  (1.6 m)   Wt 159 lb 9.6 oz (72.4 kg)   SpO2 98%   BMI 28.27 kg/m    Patient complains of symptoms of a URI, possible sinusitis. Symptoms include left ear pressure/pain, congestion, cough described as productive, headache described as pressure, nasal congestion, and sinus pressure. Onset of symptoms was 10 days ago,  and has been unchanged since that time. Treatment to date: decongestants.  Denies fever, chills.   Past Medical History:  Diagnosis Date   Allergy    B12 deficiency anemia    Cancer ([facility])    melanoma    Melanoma ([place])    PONV (postoperative nausea and vomiting)      No Known Allergies  Current Outpatient Medications on File Prior to Visit  Medication Sig   clobetasol ointment (TEMOVATE) 7.07 % Apply 1 application topically 2 (two) times daily. Thin Vulvar application twice a day x 1 week, then daily x 1 week, then twice a week long term.   cyanocobalamin (,VITAMIN B-12,) 1000 MCG/ML injection Inject 1063mg once a month   Fexofenadine HCl (ALLEGRA PO) Take by mouth as needed.   fluticasone (FLONASE) 50 MCG/ACT nasal spray Place 2 sprays into both nostrils daily.   IRON-FA-B CMP-C-BIOT-PROBIOTIC PO Take by mouth daily.   SYRINGE/NEEDLE, DISP, 1 ML (B-D SYRINGE/NEEDLE 1CC/25GX5/8) 25G X 5/8" 1 ML MISC 1 SQ injection once weekly   Vitamin D, Ergocalciferol, (DRISDOL) 1.25 MG (50000 UNIT) CAPS capsule TAKE 1 CAPSULE BY MOUTH TWICE A WEEK FOR VITD DEFICIENCY   No current facility-administered medications on file prior to visit.    ROS: all negative except what is noted in the HPI.   Physical Exam:  BP 138/80   Pulse 92   Temp 97.9 F (36.6 C)   Ht '5\' 3"'$  (1.6 m)   Wt 159 lb 9.6 oz (72.4 kg)   SpO2 98%   BMI 28.27 kg/m   General  Appearance: NAD.  Awake, conversant and cooperative. Eyes: PERRLA, EOMs intact.  Sclera white.  Conjunctiva without erythema. Sinuses: Frontal/maxillary tenderness.  No nasal discharge. Nares not patent. Tender to palpation along frontal and maxillary sinuses. ENT/Mouth: Ext aud canals clear.  Bilateral TMs w/DOL and without erythema or bulging. Hearing intact.  Posterior pharynx without swelling or exudate.  Tonsils without swelling or erythema.  Neck: Supple.  No masses, nodules or thyromegaly. Respiratory: Effort is regular with non-labored  breathing. Breath sounds are equal bilaterally without rales, rhonchi, wheezing or stridor.  Cardio: RRR with no MRGs. Brisk peripheral pulses without edema.  Abdomen: Active BS in all four quadrants.  Soft and non-tender without guarding, rebound tenderness, hernias or masses. Lymphatics: Non tender without lymphadenopathy.  Musculoskeletal: Full ROM, 5/5 strength, normal ambulation.  No clubbing or cyanosis. Skin: Appropriate color for ethnicity. Warm without rashes, lesions, ecchymosis, ulcers.  Neuro: CN II-XII grossly normal. Normal muscle tone without cerebellar symptoms and intact sensation.   Psych: AO X 3,  appropriate mood and affect, insight and judgment.     Darrol Jump, NP 9:49 PM [facility]

## 2022-05-01 NOTE — Patient Instructions (Signed)

## 2022-05-08 ENCOUNTER — Other Ambulatory Visit: Payer: Self-pay

## 2022-05-08 MED ORDER — VITAMIN D (ERGOCALCIFEROL) 1.25 MG (50000 UNIT) PO CAPS: ORAL_CAPSULE | ORAL | 1 refills | Status: DC

## 2022-06-05 ENCOUNTER — Encounter: Payer: Self-pay | Admitting: Obstetrics & Gynecology

## 2022-06-05 ENCOUNTER — Ambulatory Visit (INDEPENDENT_AMBULATORY_CARE_PROVIDER_SITE_OTHER): Payer: 59 | Admitting: Obstetrics & Gynecology

## 2022-06-05 VITALS — BP 104/62 | HR 72 | Resp 16 | Ht 62.5 in | Wt 159.0 lb

## 2022-06-05 DIAGNOSIS — N904 Leukoplakia of vulva: Secondary | ICD-10-CM | POA: Diagnosis not present

## 2022-06-05 DIAGNOSIS — Z9851 Tubal ligation status: Secondary | ICD-10-CM

## 2022-06-05 DIAGNOSIS — N643 Galactorrhea not associated with childbirth: Secondary | ICD-10-CM | POA: Diagnosis not present

## 2022-06-05 DIAGNOSIS — Z01419 Encounter for gynecological examination (general) (routine) without abnormal findings: Secondary | ICD-10-CM | POA: Diagnosis not present

## 2022-06-05 MED ORDER — CLOBETASOL PROPIONATE 0.05 % EX OINT: 1.0000 | TOPICAL_OINTMENT | CUTANEOUS | 4 refills | Status: AC

## 2022-06-05 NOTE — Progress Notes (Signed)
Anna Hernandez November 29, 1970 [id number]   History:    52 y.o. G3P3L4 Married.  S/P TL.  Children twin  49 yo, 52 yo and 44 yo.   RP:  Established patient presenting for annual gyn exam and severe vulvar discomfort/pain   HPI: Menses light but every 3-4 weeks recently. No BTB.  No pelvic pain.  No pain with IC.  Pap Neg 02/2021. Lichen sclerosus of vulva well on Clobetasol ointment when needed. Mild urinary incontinence.  BMs normal.  Breasts normal except for bilateral clear nipple discharge x about 2 years.  Mammo Neg 02/2022.  Not exercising regularly.  BMI 27.14. Health labs with Fam MD. COLONOSCOPY: 07-13-19    Past medical history,surgical history, family history and social history were all reviewed and documented in the EPIC chart.  Gynecologic History Patient's last menstrual period was 05/20/2022 (exact date).  Obstetric History OB History  Gravida Para Term Preterm AB Living  '3 3 3     4  '$ SAB IAB Ectopic Multiple Live Births        1      # Outcome Date GA Lbr Len/2nd Weight Sex Delivery Anes PTL Lv  3A Term           3B Term           2 Term           1 Term              ROS: A ROS was performed and pertinent positives and negatives are included in the history. GENERAL: No fevers or chills. HEENT: No change in vision, no earache, sore throat or sinus congestion. NECK: No pain or stiffness. CARDIOVASCULAR: No chest pain or pressure. No palpitations. PULMONARY: No shortness of breath, cough or wheeze. GASTROINTESTINAL: No abdominal pain, nausea, vomiting or diarrhea, melena or bright red blood per rectum. GENITOURINARY: No urinary frequency, urgency, hesitancy or dysuria. MUSCULOSKELETAL: No joint or muscle pain, no back pain, no recent trauma. DERMATOLOGIC: No rash, no itching, no lesions. ENDOCRINE: No polyuria, polydipsia, no heat or cold intolerance. No recent change in weight. HEMATOLOGICAL: No anemia or easy bruising or bleeding. NEUROLOGIC: No headache, seizures,  numbness, tingling or weakness. PSYCHIATRIC: No depression, no loss of interest in normal activity or change in sleep pattern.     Exam:   BP 104/62   Pulse 72   Resp 16   Ht 5' 2.5" (1.588 m)   Wt 159 lb (72.1 kg)   LMP 05/20/2022 (Exact Date) Comment: not sexually active-btl  BMI 28.62 kg/m   Body mass index is 28.62 kg/m.  General appearance : Well developed well nourished female. No acute distress HEENT: Eyes: no retinal hemorrhage or exudates,  Neck supple, trachea midline, no carotid bruits, no thyroidmegaly Lungs: Clear to auscultation, no rhonchi or wheezes, or rib retractions  Heart: Regular rate and rhythm, no murmurs or gallops Breast:Examined in sitting and supine position were symmetrical in appearance, no palpable masses or tenderness,  no skin retraction, no nipple inversion, no nipple discharge, no skin discoloration, no axillary or supraclavicular lymphadenopathy Abdomen: no palpable masses or tenderness, no rebound or guarding Extremities: no edema or skin discoloration or tenderness  Pelvic: Vulva: White atrophy typical of Lichen sclerosus             Vagina: No gross lesions or discharge  Cervix: No gross lesions or discharge  Uterus  AV, normal size, shape and consistency, non-tender and mobile  Adnexa  Without masses  or tenderness  Anus: Normal   Assessment/Plan:  52 y.o. female for annual exam   1. Well female exam with routine gynecological exam Menses light but every 3-4 weeks recently. No BTB.  No pelvic pain.  No pain with IC.  Pap Neg 02/2021. Lichen sclerosus of vulva well on Clobetasol ointment when needed. Mild urinary incontinence.  BMs normal.  Breasts normal except for bilateral clear nipple discharge x about 2 years.  Mammo Neg 02/2022. Not exercising regularly.  BMI 27.14. Health labs with Fam MD. COLONOSCOPY: 07-13-19.  2. S/P tubal ligation  3. Bilateral galactorrhea Breast exam normal except for bilateral clear nipple discharge with  pressure. Screening mammo Neg 02/2022.  Will draw Prolactin today.  Management per results. - Prolactin  4. Lichen sclerosus et atrophicus of the vulva Lichen sclerosus of vulva well on Clobetasol ointment when needed.  Other orders - clobetasol ointment (TEMOVATE) 0.05 %; Apply 1 Application topically 2 (two) times a week. Thin Vulvar application   Princess Bruins MD, 2:38 PM

## 2022-06-06 LAB — PROLACTIN: Prolactin: 6.5 ng/mL

## 2022-07-20 ENCOUNTER — Other Ambulatory Visit: Payer: Self-pay | Admitting: Nurse Practitioner

## 2022-08-07 ENCOUNTER — Encounter: Payer: 59 | Admitting: Nurse Practitioner

## 2022-08-14 ENCOUNTER — Encounter: Payer: Self-pay | Admitting: Nurse Practitioner

## 2022-08-14 ENCOUNTER — Ambulatory Visit (INDEPENDENT_AMBULATORY_CARE_PROVIDER_SITE_OTHER): Payer: 59 | Admitting: Nurse Practitioner

## 2022-08-14 VITALS — BP 100/76 | HR 82 | Temp 97.8°F | Ht 62.75 in | Wt 163.4 lb

## 2022-08-14 DIAGNOSIS — K5901 Slow transit constipation: Secondary | ICD-10-CM

## 2022-08-14 DIAGNOSIS — I10 Essential (primary) hypertension: Secondary | ICD-10-CM | POA: Diagnosis not present

## 2022-08-14 DIAGNOSIS — E663 Overweight: Secondary | ICD-10-CM

## 2022-08-14 DIAGNOSIS — Z131 Encounter for screening for diabetes mellitus: Secondary | ICD-10-CM

## 2022-08-14 DIAGNOSIS — J309 Allergic rhinitis, unspecified: Secondary | ICD-10-CM

## 2022-08-14 DIAGNOSIS — Z136 Encounter for screening for cardiovascular disorders: Secondary | ICD-10-CM

## 2022-08-14 DIAGNOSIS — E538 Deficiency of other specified B group vitamins: Secondary | ICD-10-CM

## 2022-08-14 DIAGNOSIS — Z0000 Encounter for general adult medical examination without abnormal findings: Secondary | ICD-10-CM | POA: Diagnosis not present

## 2022-08-14 DIAGNOSIS — C4360 Malignant melanoma of unspecified upper limb, including shoulder: Secondary | ICD-10-CM

## 2022-08-14 DIAGNOSIS — Z0001 Encounter for general adult medical examination with abnormal findings: Secondary | ICD-10-CM

## 2022-08-14 DIAGNOSIS — D508 Other iron deficiency anemias: Secondary | ICD-10-CM

## 2022-08-14 DIAGNOSIS — E785 Hyperlipidemia, unspecified: Secondary | ICD-10-CM

## 2022-08-14 DIAGNOSIS — Z1389 Encounter for screening for other disorder: Secondary | ICD-10-CM

## 2022-08-14 DIAGNOSIS — Z86010 Personal history of colonic polyps: Secondary | ICD-10-CM

## 2022-08-14 DIAGNOSIS — Z79899 Other long term (current) drug therapy: Secondary | ICD-10-CM

## 2022-08-14 DIAGNOSIS — E559 Vitamin D deficiency, unspecified: Secondary | ICD-10-CM

## 2022-08-14 NOTE — Progress Notes (Signed)
Complete Physical  Assessment and Plan:  Encounter for Annual Physical Exam with abnormal findings Due annually  Health Maintenance reviewed Healthy lifestyle reviewed and goals set  Malignant melanoma of upper extremity, including shoulder, unspecified laterality (HCC) Follows with derm annually, no concerning areas today  Allergic rhinitis, unspecified seasonality, unspecified trigger Allegra OTC, increase H20, allergy hygiene explained.  Medication management All medications discussed and reviewed in full. All questions and concerns regarding medications addressed.     Vitamin D deficiency Continue supplement for goal of 60-100 Monitor Vitamin D levels  Hyperlipidemia Discussed lifestyle modifications. Recommended diet heavy in fruits and veggies, omega 3's. Decrease consumption of animal meats, cheeses, and dairy products. Remain active and exercise as tolerated. Continue to monitor.  Screening for blood or protein in urine -     Urinalysis, Routine w reflex microscopic  Screening diabetes/family history of diabetes      -      A1C  Iron/b12 def, anemia -     CBC -     Iron, Total/Total Iron Binding Cap -     Ferritin -     Vitamin B12  Constipation Intermittent Continue fiber/ water intake, decrease caffeine, increase activity level.  Hyperlipidemia Discussed lifestyle modifications. Recommended diet heavy in fruits and veggies, omega 3's. Decrease consumption of animal meats, cheeses, and dairy products. Remain active and exercise as tolerated. Continue to monitor. Check lipids/TSH  Overweight Discussed appropriate BMI Diet modification. Physical activity. Encouraged/praised to build confidence.  Hx of adenomatous polyp Colonoscopy UTD Continue to monitor  Orders Placed This Encounter  Procedures   CBC with Differential/Platelet   COMPLETE METABOLIC PANEL WITH GFR   Lipid panel   TSH   Hemoglobin A1c   Insulin, random   VITAMIN D 25  Hydroxy (Vit-D Deficiency, Fractures)   Urinalysis, Routine w reflex microscopic   Microalbumin / creatinine urine ratio   Vitamin B12   Iron, TIBC and Ferritin Panel   EKG 12-Lead    Notify office for further evaluation and treatment, questions or concerns if any reported s/s fail to improve.   The patient was advised to call back or seek an in-person evaluation if any symptoms worsen or if the condition fails to improve as anticipated.   Further disposition pending results of labs. Discussed med's effects and SE's.    I discussed the assessment and treatment plan with the patient. The patient was provided an opportunity to ask questions and all were answered. The patient agreed with the plan and demonstrated an understanding of the instructions.  Discussed med's effects and SE's. Screening labs and tests as requested with regular follow-up as recommended.  I provided 40 minutes of face-to-face time during this encounter including counseling, chart review, and critical decision making was preformed.  Today's Plan of Care is based on a patient-centered health care approach known as shared decision making - the decisions, tests and treatments allow for patient preferences and values to be balanced with clinical evidence.    Future Appointments  Date Time Provider Department Center  08/17/2023  2:00 PM Adela Glimpse, NP [facility] None    HPI  52 y.o. female  presents for a complete physical. She has Melanoma (HCC); Allergic rhinitis; Calcium oxalate crystals in urine; Overweight (BMI 25.0-29.9); History of adenomatous polyp of colon; Iron deficiency anemia; B12 deficiency; Hyperlipidemia; and Constipation on their problem list.   She is married, 4 kids, 40 oldest, 61 y/o twins. She works for [employer] at [employer]. Overall she reports feeling  well today.  She has no new complaints or concerns.   She continues to follow with GYN, has had tubes tied, still has cycles that are mostly  regular, monthly, sometimes bimonthly.  May be experiencing weight gain, menopausal symptoms. Get mammograms annually at [facility], last 02/19/2022, did have a cyst per [place].    She follows with [facility] derm annually, had melanoma removed left shoulder 2013 and had BCC to temple. UTD for yearly screenings.  No recent concerns.   BMI is Body mass index is 29.18 kg/m., she has been working on diet and exercise.  Wt Readings from Last 3 Encounters:  08/14/22 163 lb 6.4 oz (74.1 kg)  06/05/22 159 lb (72.1 kg)  04/30/22 159 lb 9.6 oz (72.4 kg)   Her blood pressure has been controlled at home, today their BP is BP: 100/76  She does workout, 30 mins workout, walking, yoga.  She denies chest pain, shortness of breath, dizziness.   She is not on cholesterol medication and denies myalgias. Her cholesterol is not at goal. The cholesterol last visit was:   Lab Results  Component Value Date   CHOL 183 08/01/2021   HDL 50 08/01/2021   LDLCALC 105 (H) 08/01/2021   TRIG 162 (H) 08/01/2021   CHOLHDL 3.7 08/01/2021    Last A1C in the office was:  Lab Results  Component Value Date   HGBA1C 5.2 08/01/2021   Patient is on Vitamin D supplement, has been taking 11914 IU twice weekly  Lab Results  Component Value Date   VD25OH 28 08/01/2021   She has had anemia with iron and B12 deficiency Had colonoscopy 07/13/2019 with 1 polyp, diverticula, 7 year recall by Dr. Orvan Falconer    Latest Ref Rng & Units 08/01/2021    4:18 PM 01/31/2021    3:37 PM 08/01/2020    3:59 PM  CBC  WBC 3.8 - 10.8 Thousand/uL 8.7  5.6  7.4   Hemoglobin 11.7 - 15.5 g/dL 78.2  95.6  21.3   Hematocrit 35.0 - 45.0 % 36.8  34.8  38.1   Platelets 140 - 400 Thousand/uL 315  315  321    Taking daily iron supplement - low dose, 4.2 mg iron fumarate Lab Results  Component Value Date   IRON 61 08/01/2021   TIBC 437 08/01/2021   FERRITIN 13 (L) 08/01/2021   She is on B12 shots but not consistently, last was over 6 months ago  Lab  Results  Component Value Date   VITAMINB12 304 08/01/2021     Current Medications:  Current Outpatient Medications on File Prior to Visit  Medication Sig Dispense Refill   clobetasol ointment (TEMOVATE) 0.05 % Apply 1 Application topically 2 (two) times a week. Thin Vulvar application 30 g 4   Fexofenadine HCl (ALLEGRA PO) Take by mouth as needed.     fluticasone (FLONASE) 50 MCG/ACT nasal spray Place 2 sprays into both nostrils daily. 16 g 3   IRON-FA-B CMP-C-BIOT-PROBIOTIC PO Take by mouth daily.     SYRINGE/NEEDLE, DISP, 1 ML (B-D SYRINGE/NEEDLE 1CC/25GX5/8) 25G X 5/8" 1 ML MISC 1 SQ injection once weekly 100 each 1   Vitamin D, Ergocalciferol, (DRISDOL) 1.25 MG (50000 UNIT) CAPS capsule TAKE 1 CAPSULE BY MOUTH TWICE WEEKLY FOR VITD DEFICIENCY 36 capsule 1   No current facility-administered medications on file prior to visit.   Health Maintenance:   Immunization History  Administered Date(s) Administered   Influenza Split 02/06/2015   Influenza-Unspecified 01/05/2014   Moderna Sars-Covid-2  Vaccination 06/28/2019, 07/26/2019   PPD Test 03/21/2013, 03/24/2014   Td 04/08/2003   Tdap 03/24/2014   Health Maintenance  Topic Date Due   Zoster Vaccines- Shingrix (1 of 2) Never done   COVID-19 Vaccine (3 - Moderna risk series) 08/23/2019   INFLUENZA VACCINE  11/06/2022   MAMMOGRAM  02/11/2023   PAP SMEAR-Modifier  03/06/2024   DTaP/Tdap/Td (3 - Td or Tdap) 03/24/2024   COLONOSCOPY (Pts 45-74yrs Insurance coverage will need to be confirmed)  07/13/2026   HPV VACCINES  Aged Out   Hepatitis C Screening  Discontinued   HIV Screening  Discontinued   Flu vaccine: gets annually at work Shingrix: ask insurance about coverage   LMP: 07/28/22 Pap: 03/06/2021,  PAP/neg HPV, ASC[facility] Dr. Fuller Plan MGM: 02/19/2022 [employer] DEXA: N/A  Colonoscopy: 07/13/19 1 polyp, diverticula Dr. Orvan Falconer, 7 year recall  EGD: N/A  Last Dental Exam: Dr. Marijo Conception q6 mo Last Eye Exam: [facility], contacts,  last 2023 [facility] ANNUAL VISIT   Patient Care Team: Lucky Cowboy, MD as PCP - General (Internal Medicine) Chyrl Civatte, FNP as Nurse Practitioner (Obstetrics and Gynecology) Venancio Poisson, MD as Consulting Physician (Dermatology) Eileen Stanford, MD as Referring Physician (Allergy and Immunology)  Medical History:  Past Medical History:  Diagnosis Date   Allergy    B12 deficiency anemia    Cancer (HCC)    melanoma    Melanoma (HCC)    PONV (postoperative nausea and vomiting)    Allergies No Known Allergies  SURGICAL HISTORY She  has a past surgical history that includes Cesarean section; Tubal ligation; Melanoma excised; Wisdom tooth extraction; and Colonoscopy w/ polypectomy (2021).   FAMILY HISTORY Her family history includes Cancer in her maternal grandmother, paternal grandfather, and paternal grandmother; Colon polyps in her father and mother; Diabetes in her father; Heart disease in her maternal grandfather; Hypertension in her father and mother; Melanoma in her mother; Stroke in her mother.  Paternal Grandmother and Paternal aunt had menangiomas  SOCIAL HISTORY She  reports that she quit smoking about 24 years ago. Her smoking use included cigarettes. She has a 12.00 pack-year smoking history. She has never used smokeless tobacco. She reports current alcohol use of about 1.0 standard drink of alcohol per week. She reports that she does not use drugs.   Review of Systems: Review of Systems  Constitutional:  Negative for malaise/fatigue and weight loss.  HENT:  Negative for hearing loss and tinnitus.   Eyes:  Negative for blurred vision and double vision.  Respiratory:  Negative for cough, shortness of breath and wheezing.   Cardiovascular:  Negative for chest pain, palpitations, orthopnea, claudication and leg swelling.  Gastrointestinal:  Positive for constipation (may go up to a week without movement). Negative for abdominal pain, blood in stool, diarrhea, heartburn,  melena, nausea and vomiting.  Genitourinary: Negative.   Musculoskeletal:  Negative for back pain, falls, joint pain and myalgias.  Skin:  Negative for rash.  Neurological:  Negative for dizziness, tingling, sensory change, weakness and headaches.  Endo/Heme/Allergies:  Negative for polydipsia.  Psychiatric/Behavioral: Negative.    All other systems reviewed and are negative.   Physical Exam: Estimated body mass index is 29.18 kg/m as calculated from the following:   Height as of this encounter: 5' 2.75" (1.594 m).   Weight as of this encounter: 163 lb 6.4 oz (74.1 kg). BP 100/76   Pulse 82   Temp 97.8 F (36.6 C)   Ht 5' 2.75" (1.594 m)   Wt  163 lb 6.4 oz (74.1 kg)   SpO2 99%   BMI 29.18 kg/m  General Appearance: Well nourished, in no apparent distress.  Eyes: PERRLA, EOMs, conjunctiva no swelling or erythema Sinuses: No Frontal/maxillary tenderness  ENT/Mouth: Ext aud canals clear, normal light reflex with TMs without erythema, bulging. Good dentition. No erythema, swelling, or exudate on post pharynx. Tonsils not swollen or erythematous. Hearing normal.  Neck: Supple, thyroid normal. No bruits  Respiratory: Respiratory effort normal, BS equal bilaterally without rales, rhonchi, wheezing or stridor.  Cardio: RRR without murmurs, rubs or gallops. Brisk peripheral pulses without edema.  Chest: symmetric, with normal excursions and percussion.  Breasts: defer to GYN Abdomen: Soft, nontender, no guarding, rebound, hernias, masses, or organomegaly.  Lymphatics: Non tender without lymphadenopathy.  Genitourinary: defer to GYN Musculoskeletal: Full ROM all peripheral extremities,5/5 strength, and normal gait.  Skin: Warm, dry without rashes, lesions, ecchymosis. Neuro: Cranial nerves intact, reflexes equal bilaterally. Normal muscle tone, no cerebellar symptoms. Sensation intact.  Psych: Awake and oriented X 3, normal affect, Insight and Judgment appropriate.   EKG: NSR  Jadore Veals 2:32 PM [facility]

## 2022-08-14 NOTE — Patient Instructions (Signed)

## 2022-08-15 ENCOUNTER — Encounter: Payer: Self-pay | Admitting: Nurse Practitioner

## 2022-08-15 DIAGNOSIS — D508 Other iron deficiency anemias: Secondary | ICD-10-CM

## 2022-08-15 LAB — CBC WITH DIFFERENTIAL/PLATELET
Absolute Monocytes: 403 cells/uL (ref 200–950)
Basophils Absolute: 43 cells/uL (ref 0–200)
Basophils Relative: 0.6 %
Eosinophils Absolute: 72 cells/uL (ref 15–500)
Eosinophils Relative: 1 %
HCT: 33.2 % — ABNORMAL LOW (ref 35.0–45.0)
Hemoglobin: 10.5 g/dL — ABNORMAL LOW (ref 11.7–15.5)
Lymphs Abs: 1822 cells/uL (ref 850–3900)
MCH: 27 pg (ref 27.0–33.0)
MCHC: 31.6 g/dL — ABNORMAL LOW (ref 32.0–36.0)
MCV: 85.3 fL (ref 80.0–100.0)
MPV: 10.9 fL (ref 7.5–12.5)
Monocytes Relative: 5.6 %
Neutro Abs: 4860 cells/uL (ref 1500–7800)
Neutrophils Relative %: 67.5 %
Platelets: 338 10*3/uL (ref 140–400)
RBC: 3.89 10*6/uL (ref 3.80–5.10)
RDW: 13 % (ref 11.0–15.0)
Total Lymphocyte: 25.3 %
WBC: 7.2 10*3/uL (ref 3.8–10.8)

## 2022-08-15 LAB — LIPID PANEL
Cholesterol: 175 mg/dL (ref <200)
HDL: 65 mg/dL (ref >=50)
LDL Cholesterol (Calc): 91 mg/dL (calc)
Non-HDL Cholesterol (Calc): 110 mg/dL (calc) (ref <130)
Total CHOL/HDL Ratio: 2.7 (calc) (ref <5.0)
Triglycerides: 96 mg/dL (ref <150)

## 2022-08-15 LAB — COMPLETE METABOLIC PANEL WITH GFR
AG Ratio: 1.7 (calc) (ref 1.0–2.5)
ALT: 11 U/L (ref 6–29)
AST: 15 U/L (ref 10–35)
Albumin: 4.5 g/dL (ref 3.6–5.1)
Alkaline phosphatase (APISO): 68 U/L (ref 37–153)
BUN: 8 mg/dL (ref 7–25)
CO2: 23 mmol/L (ref 20–32)
Calcium: 8.8 mg/dL (ref 8.6–10.4)
Chloride: 107 mmol/L (ref 98–110)
Creat: 0.66 mg/dL (ref 0.50–1.03)
Globulin: 2.6 g/dL (calc) (ref 1.9–3.7)
Glucose, Bld: 87 mg/dL (ref 65–99)
Potassium: 3.9 mmol/L (ref 3.5–5.3)
Sodium: 138 mmol/L (ref 135–146)
Total Bilirubin: 0.4 mg/dL (ref 0.2–1.2)
Total Protein: 7.1 g/dL (ref 6.1–8.1)
eGFR: 105 mL/min/{1.73_m2} (ref >=60)

## 2022-08-15 LAB — HEMOGLOBIN A1C
Hgb A1c MFr Bld: 5.6 % of total Hgb (ref <5.7)
Mean Plasma Glucose: 114 mg/dL
eAG (mmol/L): 6.3 mmol/L

## 2022-08-15 LAB — MICROALBUMIN / CREATININE URINE RATIO
Creatinine, Urine: 34 mg/dL (ref 20–275)
Microalb, Ur: <0.2 mg/dL

## 2022-08-15 LAB — IRON,TIBC AND FERRITIN PANEL
%SAT: 7 % (calc) — ABNORMAL LOW (ref 16–45)
Ferritin: 4 ng/mL — ABNORMAL LOW (ref 16–232)
Iron: 36 ug/dL — ABNORMAL LOW (ref 45–160)
TIBC: 509 mcg/dL (calc) — ABNORMAL HIGH (ref 250–450)

## 2022-08-15 LAB — URINALYSIS, ROUTINE W REFLEX MICROSCOPIC
Bilirubin Urine: NEGATIVE
Glucose, UA: NEGATIVE
Hgb urine dipstick: NEGATIVE
Ketones, ur: NEGATIVE
Leukocytes,Ua: NEGATIVE
Nitrite: NEGATIVE
Protein, ur: NEGATIVE
Specific Gravity, Urine: 1.007 (ref 1.001–1.035)
pH: 6 (ref 5.0–8.0)

## 2022-08-15 LAB — TSH: TSH: 1.07 mIU/L

## 2022-08-15 LAB — VITAMIN B12: Vitamin B-12: 255 pg/mL (ref 200–1100)

## 2022-08-15 LAB — INSULIN, RANDOM: Insulin: 7.7 u[IU]/mL

## 2022-08-15 LAB — VITAMIN D 25 HYDROXY (VIT D DEFICIENCY, FRACTURES): Vit D, 25-Hydroxy: 56 ng/mL (ref 30–100)

## 2022-08-18 ENCOUNTER — Other Ambulatory Visit: Payer: Self-pay | Admitting: Nurse Practitioner

## 2022-08-18 DIAGNOSIS — D508 Other iron deficiency anemias: Secondary | ICD-10-CM

## 2022-08-18 MED ORDER — FERROUS SULFATE 325 (65 FE) MG PO TBEC
325.0000 mg | DELAYED_RELEASE_TABLET | Freq: Two times a day (BID) | ORAL | 2 refills | Status: DC
Start: 2022-08-18 — End: 2023-08-12

## 2023-02-17 ENCOUNTER — Encounter: Payer: Self-pay | Admitting: Obstetrics and Gynecology

## 2023-02-20 ENCOUNTER — Encounter: Payer: Self-pay | Admitting: Obstetrics and Gynecology

## 2023-02-23 ENCOUNTER — Telehealth: Payer: Self-pay | Admitting: *Deleted

## 2023-02-23 NOTE — Telephone Encounter (Signed)
Patient left voicemail stating she has a biopsy scheduled for Thursday, inquiring about Toradol 30 min prior.   Per review of EPIC, no biopsy scheduled.   Left message to call [facility] Triage at [phone], option 4.

## 2023-03-03 NOTE — Telephone Encounter (Signed)
No returned call and pt not scheduled for biopsy with this office nor recommended per investigation of pt's EMR.  Encounter closed, routing to provider for final review.

## 2023-03-09 ENCOUNTER — Encounter: Payer: Self-pay | Admitting: Obstetrics and Gynecology

## 2023-06-08 ENCOUNTER — Ambulatory Visit (INDEPENDENT_AMBULATORY_CARE_PROVIDER_SITE_OTHER): Payer: 59 | Admitting: Obstetrics and Gynecology

## 2023-06-08 ENCOUNTER — Other Ambulatory Visit (HOSPITAL_COMMUNITY)
Admission: RE | Admit: 2023-06-08 | Discharge: 2023-06-08 | Disposition: A | Discharge status: Home / Self Care | Source: Ambulatory Visit | Attending: Obstetrics and Gynecology | Admitting: Obstetrics and Gynecology

## 2023-06-08 ENCOUNTER — Encounter: Payer: Self-pay | Admitting: Obstetrics and Gynecology

## 2023-06-08 VITALS — BP 104/78 | HR 71 | Ht 63.0 in | Wt 151.0 lb

## 2023-06-08 DIAGNOSIS — Z01419 Encounter for gynecological examination (general) (routine) without abnormal findings: Secondary | ICD-10-CM | POA: Diagnosis not present

## 2023-06-08 DIAGNOSIS — N841 Polyp of cervix uteri: Secondary | ICD-10-CM | POA: Insufficient documentation

## 2023-06-08 DIAGNOSIS — N3281 Overactive bladder: Secondary | ICD-10-CM | POA: Diagnosis not present

## 2023-06-08 DIAGNOSIS — N951 Menopausal and female climacteric states: Secondary | ICD-10-CM

## 2023-06-08 DIAGNOSIS — N952 Postmenopausal atrophic vaginitis: Secondary | ICD-10-CM | POA: Diagnosis not present

## 2023-06-08 DIAGNOSIS — N8110 Cystocele, unspecified: Secondary | ICD-10-CM

## 2023-06-08 DIAGNOSIS — N950 Postmenopausal bleeding: Secondary | ICD-10-CM | POA: Insufficient documentation

## 2023-06-08 MED ORDER — INTRAROSA 6.5 MG VA INST: 1.0000 | VAGINAL_INSERT | Freq: Every evening | VAGINAL | 12 refills | Status: DC | PRN

## 2023-06-08 NOTE — Progress Notes (Signed)
 53 y.o. y.o. female here for annual exam. Patient's last menstrual period was 04/22/2023 (approximate). Period Duration (Days): 5 Menstrual Flow: Heavy Menstrual Control: Maxi pad, Tampon Dysmenorrhea: None  G3P3L4 Married.  S/P TL.  Children twin  52 yo, 55 yo and 38 yo.   RP:  Established patient presenting for annual gyn exam    HPI: Menses are heavy uses a pad Q2hours the first two days.  Has 3 lighter days after. Has missed two periods in the last year. Has noticed occasional spotting after a period a few months. Having so SUI and vaginal dryness and hot flashes.  Pap Neg 02/2021. Lichen sclerosus of vulva well on Clobetasol ointment when needed.  BMs normal.   Mammo Neg 02/2023 with biopsy.  Not exercising regularly. Health labs with Fam MD. Alric Seton found at cesarean delivery.  COLONOSCOPY: 07-13-19  Body mass index is 26.75 kg/m.'  There is no height or weight on file to calculate BMI.     08/01/2021    3:39 PM 08/01/2020    3:37 PM 05/23/2020   11:40 AM  Depression screen PHQ 2/9  Decreased Interest 0 0   Down, Depressed, Hopeless 0 0 0  PHQ - 2 Score 0 0 0    Last menstrual period 04/22/2023.     Component Value Date/Time   DIAGPAP  03/06/2021 1701    - Negative for Intraepithelial Lesions or Malignancy (NILM)   DIAGPAP - Benign reactive/reparative changes 03/06/2021 1701   HPVHIGH Negative 03/06/2021 1701   ADEQPAP  03/06/2021 1701    Satisfactory for evaluation; transformation zone component PRESENT.    GYN HISTORY:    Component Value Date/Time   DIAGPAP  03/06/2021 1701    - Negative for Intraepithelial Lesions or Malignancy (NILM)   DIAGPAP - Benign reactive/reparative changes 03/06/2021 1701   HPVHIGH Negative 03/06/2021 1701   ADEQPAP  03/06/2021 1701    Satisfactory for evaluation; transformation zone component PRESENT.    OB History  Gravida Para Term Preterm AB Living  3 3 3   4   SAB IAB Ectopic Multiple Live Births     1     # Outcome  Date GA Lbr Len/2nd Weight Sex Type Anes PTL Lv  3A Term           3B Term           2 Term           1 Term             Past Medical History:  Diagnosis Date   Allergy    B12 deficiency anemia    Cancer (HCC)    melanoma    Melanoma (HCC)    PONV (postoperative nausea and vomiting)     Past Surgical History:  Procedure Laterality Date   CESAREAN SECTION     LEFT OVARIAN ENDOMETRIOMA AND BTSP   COLONOSCOPY W/ POLYPECTOMY  2021   Melanoma excised     TUBAL LIGATION     AT TIME OF C/S   WISDOM TOOTH EXTRACTION      Current Outpatient Medications on File Prior to Visit  Medication Sig Dispense Refill   clobetasol ointment (TEMOVATE) 0.05 % Apply 1 Application topically 2 (two) times a week. Thin Vulvar application 30 g 4   Fexofenadine HCl (ALLEGRA PO) Take by mouth as needed.     fluticasone (FLONASE) 50 MCG/ACT nasal spray Place 2 sprays into both nostrils daily. 16 g 3   IRON-FA-B CMP-C-BIOT-PROBIOTIC  PO Take by mouth daily.     Vitamin D, Ergocalciferol, (DRISDOL) 1.25 MG (50000 UNIT) CAPS capsule TAKE 1 CAPSULE BY MOUTH TWICE WEEKLY FOR VITD DEFICIENCY 36 capsule 1   ferrous sulfate 325 (65 FE) MG EC tablet Take 1 tablet (325 mg total) by mouth 2 (two) times daily. 60 tablet 2   SYRINGE/NEEDLE, DISP, 1 ML (B-D SYRINGE/NEEDLE 1CC/25GX5/8) 25G X 5/8" 1 ML MISC 1 SQ injection once weekly (Patient not taking: Reported on 06/08/2023) 100 each 1   No current facility-administered medications on file prior to visit.    Social History   Socioeconomic History   Marital status: Married    Spouse name: Not on file   Number of children: Not on file   Years of education: Not on file   Highest education level: Not on file  Occupational History   Not on file  Tobacco Use   Smoking status: Former    Current packs/day: 0.00    Average packs/day: 1 pack/day for 12.0 years (12.0 ttl pk-yrs)    Types: Cigarettes    Start date: 03/21/1986    Quit date: 03/21/1998    Years since  quitting: 25.2   Smokeless tobacco: Never  Vaping Use   Vaping status: Never Used  Substance and Sexual Activity   Alcohol use: Yes    Alcohol/week: 1.0 standard drink of alcohol    Types: 1 Standard drinks or equivalent per week   Drug use: No   Sexual activity: Not Currently    Partners: Male    Birth control/protection: Surgical, Abstinence    Comment: BTL, First IC >11 y/o  Other Topics Concern   Not on file  Social History Narrative   Not on file   Social Drivers of Health   Financial Resource Strain: Not on file  Food Insecurity: Not on file  Transportation Needs: Not on file  Physical Activity: Not on file  Stress: Not on file  Social Connections: Not on file  Intimate Partner Violence: Not on file    Family History  Problem Relation Age of Onset   Hypertension Mother    Stroke Mother        TIA   Colon polyps Mother    Melanoma Mother    Hypertension Father    Diabetes Father    Colon polyps Father    Cancer Maternal Grandmother        OVARIAN/ leukemia   Heart disease Maternal Grandfather    Cancer Paternal Grandmother        Brain tumor meningioma   Cancer Paternal Grandfather        Lung     No Known Allergies    Patient's last menstrual period was Patient's last menstrual period was 04/22/2023 (approximate)..            Review of Systems Alls systems reviewed and are negative.     Physical Exam Constitutional:      Appearance: Normal appearance.  Genitourinary:     Vulva and urethral meatus normal.     No lesions in the vagina.     Right Labia: No rash, lesions or skin changes.    Left Labia: No lesions, skin changes or rash.       No vaginal discharge or tenderness.     No vaginal prolapse present.    No vaginal atrophy present.     Right Adnexa: not tender, not palpable and no mass present.    Left Adnexa: not tender, not palpable  and no mass present.    No cervical motion tenderness or discharge.     Uterus is irregular.      Uterus is not tender.  Breasts:    Right: Normal.     Left: Normal.  HENT:     Head: Normocephalic.  Neck:     Thyroid: No thyroid mass, thyromegaly or thyroid tenderness.  Cardiovascular:     Rate and Rhythm: Normal rate and regular rhythm.     Heart sounds: Normal heart sounds, S1 normal and S2 normal.  Pulmonary:     Effort: Pulmonary effort is normal.     Breath sounds: Normal breath sounds and air entry.  Abdominal:     General: There is no distension.     Palpations: Abdomen is soft. There is no mass.     Tenderness: There is no abdominal tenderness. There is no guarding or rebound.  Musculoskeletal:        General: Normal range of motion.     Cervical back: Full passive range of motion without pain, normal range of motion and neck supple. No tenderness.     Right lower leg: No edema.     Left lower leg: No edema.  Neurological:     Mental Status: She is alert.  Skin:    General: Skin is warm.  Psychiatric:        Mood and Affect: Mood normal.        Behavior: Behavior normal.        Thought Content: Thought content normal.  Vitals and nursing note reviewed. Exam conducted with a chaperone present.      A:         Well Woman GYN exam, menorrhagia, DUB, endometriosis, possible fibroid                             P:        Pap smear collected today Encouraged annual mammogram screening Colon cancer screening up-to-date DXA not indicated Labs and immunizations to do with PMD To get FSH and estradiol. Patient with heavier periods and spotting on occasional after her period has stopped.  Discussed and counseled on the importance of getting a tv US and possible EMB Encouraged healthy lifestyle practices Encouraged Vit D and Calcium  Atrophic vaginitis: to begin intrarosa.  Discussed insurance may not cover SUI: referral to urogyn for evaluation  No follow-ups on file.  Earley Favor

## 2023-06-09 ENCOUNTER — Other Ambulatory Visit: Payer: Self-pay

## 2023-06-09 ENCOUNTER — Encounter: Payer: Self-pay | Admitting: Obstetrics and Gynecology

## 2023-06-09 DIAGNOSIS — N950 Postmenopausal bleeding: Secondary | ICD-10-CM

## 2023-06-09 DIAGNOSIS — N951 Menopausal and female climacteric states: Secondary | ICD-10-CM

## 2023-06-09 LAB — ESTRADIOL: Estradiol: <15 pg/mL

## 2023-06-09 LAB — VITAMIN D 25 HYDROXY (VIT D DEFICIENCY, FRACTURES): Vit D, 25-Hydroxy: 55 ng/mL (ref 30–100)

## 2023-06-09 LAB — FOLLICLE STIMULATING HORMONE: FSH: 112.3 m[IU]/mL

## 2023-06-09 MED ORDER — INTRAROSA 6.5 MG VA INST: 1.0000 | VAGINAL_INSERT | Freq: Every day | VAGINAL | 12 refills | Status: AC

## 2023-06-10 LAB — CYTOLOGY - PAP: Diagnosis: NEGATIVE

## 2023-06-10 LAB — SURGICAL PATHOLOGY

## 2023-06-17 ENCOUNTER — Telehealth: Payer: Self-pay

## 2023-06-17 NOTE — Telephone Encounter (Signed)
 Patient aware that her insurance will not cover her intrarosa 6.5mg  inserts. Patient has not tried any other HRT, so prior authorization will not be approved. Patient will try the coupon & will let us know if it is still too expensive. Per pharmacy listed below are medications that are likely to not need a PA. Estradiol Premarin Osphena

## 2023-07-01 ENCOUNTER — Other Ambulatory Visit: Payer: Self-pay | Admitting: Obstetrics and Gynecology

## 2023-07-01 DIAGNOSIS — N952 Postmenopausal atrophic vaginitis: Secondary | ICD-10-CM

## 2023-07-01 DIAGNOSIS — N841 Polyp of cervix uteri: Secondary | ICD-10-CM

## 2023-07-01 DIAGNOSIS — N920 Excessive and frequent menstruation with regular cycle: Secondary | ICD-10-CM

## 2023-07-01 DIAGNOSIS — Z01419 Encounter for gynecological examination (general) (routine) without abnormal findings: Secondary | ICD-10-CM

## 2023-07-01 DIAGNOSIS — N3281 Overactive bladder: Secondary | ICD-10-CM

## 2023-07-01 DIAGNOSIS — N951 Menopausal and female climacteric states: Secondary | ICD-10-CM

## 2023-07-01 DIAGNOSIS — N8110 Cystocele, unspecified: Secondary | ICD-10-CM

## 2023-07-07 ENCOUNTER — Encounter: Payer: Self-pay | Admitting: Obstetrics and Gynecology

## 2023-07-07 ENCOUNTER — Ambulatory Visit (INDEPENDENT_AMBULATORY_CARE_PROVIDER_SITE_OTHER)

## 2023-07-07 ENCOUNTER — Ambulatory Visit: Admitting: Obstetrics and Gynecology

## 2023-07-07 VITALS — BP 112/78 | HR 86

## 2023-07-07 DIAGNOSIS — N840 Polyp of corpus uteri: Secondary | ICD-10-CM

## 2023-07-07 DIAGNOSIS — Z712 Person consulting for explanation of examination or test findings: Secondary | ICD-10-CM

## 2023-07-07 DIAGNOSIS — E2839 Other primary ovarian failure: Secondary | ICD-10-CM | POA: Diagnosis not present

## 2023-07-07 DIAGNOSIS — N950 Postmenopausal bleeding: Secondary | ICD-10-CM | POA: Diagnosis not present

## 2023-07-07 DIAGNOSIS — N920 Excessive and frequent menstruation with regular cycle: Secondary | ICD-10-CM

## 2023-07-07 MED ORDER — ESTRADIOL 0.0375 MG/24HR TD PTTW: 1.0000 | MEDICATED_PATCH | TRANSDERMAL | 12 refills | Status: AC

## 2023-07-07 MED ORDER — PROGESTERONE MICRONIZED 100 MG PO CAPS: 100.0000 mg | ORAL_CAPSULE | Freq: Every day | ORAL | 6 refills | Status: AC

## 2023-07-07 NOTE — H&P (View-Only) (Signed)
 53 y.o. y.o. female here for results and to discuss HRT  Just started intrarosa and still having bothersome hotflashes at night and is waking up 3-4 times She would like to begin HRT She is also here for PUS results.  Patient's last menstrual period was 04/22/2023 (approximate).    G3P3L4 Married.  S/P TL.  Children twin  54 yo, 108 yo and 65 yo.   HPI: Menses are heavy uses a pad Q2hours the first two days.  Has 3 lighter days after. Has missed two periods in the last year. Has noticed occasional spotting after a period a few months. Having SUI and vaginal dryness and hot flashes.  Pap Neg 02/2021. Lichen sclerosus of vulva well on Clobetasol ointment when needed.  BMs normal.   Mammo Neg 02/2023 with biopsy.  Not exercising regularly. Health labs with Fam MD. Alric Seton found at cesarean delivery. Dxa: referral placed for baseline COLONOSCOPY: 07-13-19  [facility] 06/08/23 112 Dxa: to get baseline  PUS with 9.97 cm anteverted uterus Normal size and shape No myometrial masses  Cervix: at least one 9.9 x 5.19mm possible polyp with feeder vessel seen (3D imaging questions two other areas or polyps) Endometrial lining: 11mm avascular area seen ub Fundal region ?polyp  Both ovaries normal size with normal follicle pattern  No adnexal masses No free fluid  There is no height or weight on file to calculate BMI.'  There is no height or weight on file to calculate BMI.     06/08/2023   11:57 AM 08/01/2021    3:39 PM 08/01/2020    3:37 PM  Depression screen PHQ 2/9  Decreased Interest 0 0 0  Down, Depressed, Hopeless 0 0 0  PHQ - 2 Score 0 0 0    Blood pressure 112/78, pulse 86, last menstrual period 04/22/2023, SpO2 99%.     Component Value Date/Time   DIAGPAP  06/08/2023 1237    - Negative for intraepithelial lesion or malignancy (NILM)   DIAGPAP  03/06/2021 1701    - Negative for Intraepithelial Lesions or Malignancy (NILM)   DIAGPAP - Benign reactive/reparative changes  03/06/2021 1701   HPVHIGH Negative 03/06/2021 1701   ADEQPAP  06/08/2023 1237    Satisfactory for evaluation; transformation zone component PRESENT.   ADEQPAP  03/06/2021 1701    Satisfactory for evaluation; transformation zone component PRESENT.    GYN HISTORY:    Component Value Date/Time   DIAGPAP  06/08/2023 1237    - Negative for intraepithelial lesion or malignancy (NILM)   DIAGPAP  03/06/2021 1701    - Negative for Intraepithelial Lesions or Malignancy (NILM)   DIAGPAP - Benign reactive/reparative changes 03/06/2021 1701   HPVHIGH Negative 03/06/2021 1701   ADEQPAP  06/08/2023 1237    Satisfactory for evaluation; transformation zone component PRESENT.   ADEQPAP  03/06/2021 1701    Satisfactory for evaluation; transformation zone component PRESENT.    OB History  Gravida Para Term Preterm AB Living  3 3 3   4   SAB IAB Ectopic Multiple Live Births     1     # Outcome Date GA Lbr Len/2nd Weight Sex Type Anes PTL Lv  3A Term           3B Term           2 Term           1 Term             Past Medical History:  Diagnosis  Date   Allergy    B12 deficiency anemia    Cancer (HCC)    melanoma    Melanoma (HCC)    PONV (postoperative nausea and vomiting)     Past Surgical History:  Procedure Laterality Date   CESAREAN SECTION     LEFT OVARIAN ENDOMETRIOMA AND BTSP   COLONOSCOPY W/ POLYPECTOMY  2021   Melanoma excised     TUBAL LIGATION     AT TIME OF C/S   WISDOM TOOTH EXTRACTION      Current Outpatient Medications on File Prior to Visit  Medication Sig Dispense Refill   clobetasol ointment (TEMOVATE) 0.05 % Apply 1 Application topically 2 (two) times a week. Thin Vulvar application 30 g 4   Fexofenadine HCl (ALLEGRA PO) Take by mouth as needed.     fluticasone (FLONASE) 50 MCG/ACT nasal spray Place 2 sprays into both nostrils daily. 16 g 3   INTRAROSA 6.5 MG INST Place 1 suppository vaginally daily.     IRON-FA-B CMP-C-BIOT-PROBIOTIC PO Take by mouth daily.      SYRINGE/NEEDLE, DISP, 1 ML (B-D SYRINGE/NEEDLE 1CC/25GX5/8) 25G X 5/8" 1 ML MISC 1 SQ injection once weekly 100 each 1   Vitamin D, Ergocalciferol, (DRISDOL) 1.25 MG (50000 UNIT) CAPS capsule TAKE 1 CAPSULE BY MOUTH TWICE WEEKLY FOR VITD DEFICIENCY 36 capsule 1   ferrous sulfate 325 (65 FE) MG EC tablet Take 1 tablet (325 mg total) by mouth 2 (two) times daily. 60 tablet 2   No current facility-administered medications on file prior to visit.    Social History   Socioeconomic History   Marital status: Married    Spouse name: Not on file   Number of children: Not on file   Years of education: Not on file   Highest education level: Not on file  Occupational History   Not on file  Tobacco Use   Smoking status: Former    Current packs/day: 0.00    Average packs/day: 1 pack/day for 12.0 years (12.0 ttl pk-yrs)    Types: Cigarettes    Start date: 03/21/1986    Quit date: 03/21/1998    Years since quitting: 25.3   Smokeless tobacco: Never  Vaping Use   Vaping status: Never Used  Substance and Sexual Activity   Alcohol use: Yes    Alcohol/week: 1.0 standard drink of alcohol    Types: 1 Standard drinks or equivalent per week   Drug use: No   Sexual activity: Not Currently    Partners: Male    Birth control/protection: Surgical, Abstinence    Comment: BTL, First IC >66 y/o  Other Topics Concern   Not on file  Social History Narrative   Not on file   Social Drivers of Health   Financial Resource Strain: Not on file  Food Insecurity: Not on file  Transportation Needs: Not on file  Physical Activity: Not on file  Stress: Not on file  Social Connections: Not on file  Intimate Partner Violence: Not on file    Family History  Problem Relation Age of Onset   Hypertension Mother    Stroke Mother        TIA   Colon polyps Mother    Melanoma Mother    Hypertension Father    Diabetes Father    Colon polyps Father    Cancer Maternal Grandmother        OVARIAN/ leukemia    Heart disease Maternal Grandfather    Cancer Paternal Grandmother  Brain tumor meningioma   Cancer Paternal Grandfather        Lung     No Known Allergies    Patient's last menstrual period was Patient's last menstrual period was 04/22/2023 (approximate)..            Review of Systems Alls systems reviewed and are negative.    from last visit: Physical Exam Constitutional:      Appearance: Normal appearance.  Genitourinary:     Vulva and urethral meatus normal.     No lesions in the vagina.     Right Labia: No rash, lesions or skin changes.    Left Labia: No lesions, skin changes or rash.    No vaginal discharge or tenderness.     No vaginal prolapse present.    No vaginal atrophy present.     Right Adnexa: not tender, not palpable and no mass present.    Left Adnexa: not tender, not palpable and no mass present.    No cervical motion tenderness or discharge.     Uterus is irregular.     Uterus is not tender.  Breasts:    Right: Normal.     Left: Normal.  HENT:     Head: Normocephalic.  Neck:     Thyroid: No thyroid mass, thyromegaly or thyroid tenderness.  Cardiovascular:     Rate and Rhythm: Normal rate and regular rhythm.     Heart sounds: Normal heart sounds, S1 normal and S2 normal.  Pulmonary:     Effort: Pulmonary effort is normal.     Breath sounds: Normal breath sounds and air entry.  Abdominal:     General: There is no distension.     Palpations: Abdomen is soft. There is no mass.     Tenderness: There is no abdominal tenderness. There is no guarding or rebound.  Musculoskeletal:        General: Normal range of motion.     Cervical back: Full passive range of motion without pain, normal range of motion and neck supple. No tenderness.     Right lower leg: No edema.     Left lower leg: No edema.  Neurological:     Mental Status: She is alert.  Skin:    General: Skin is warm.  Psychiatric:        Mood and Affect: Mood normal.         Behavior: Behavior normal.        Thought Content: Thought content normal.  Vitals and nursing note reviewed. Exam conducted with a chaperone present.       A:         Well Woman GYN exam, menorrhagia, DUB, endometriosis, endometrial polyps                             P:        Counseled on the r/b/a/I of HRT use.  Discussed that she will need progesterone to avoid unopposed estrogen on the endometrial lining and risk for endometrial cancer. Discussed lower risk for DVT and stroke with the patch. Side effects include risk of breast tenderness and spotting along with low risk of blood clots and stroke with uncontrolled hypertension. Counseled on the benefits to help improve the bone density during menopause. To begin low dose estrogen patch and prometrium at bedtime. May continue intrarosa.  PUS results were reviewed in detail with patient. Counseled on the importance of removing the polyps  and ensuring they are benign with pathology. Patient agreed. The hysteroscopy with the [facility], ECC was reviewed and discussed in detail with the patient.  The risk of bleeding, infection, injury to surrounding structures, uterine perforation with risk of additional procedure laparoscopy/laparotomy were all reviewed discussed with patient.  Risk for blood clots was discussed.  Discussed risk for bleeding after the procedure anywhere from 7 to 10 days.  She will need a driver to and from surgery as well.  Pathology results will take about 2 weeks to return.  30 minutes spent on reviewing records, imaging,  and one on one patient time and counseling patient and documentation Dr. Karma Greaser   No follow-ups on file.  Earley Favor

## 2023-07-07 NOTE — Progress Notes (Signed)
 53 y.o. y.o. female here for results and to discuss HRT  Just started intrarosa and still having bothersome hotflashes at night and is waking up 3-4 times She would like to begin HRT She is also here for PUS results.  Patient's last menstrual period was 04/22/2023 (approximate).    G3P3L4 Married.  S/P TL.  Children twin  54 yo, 108 yo and 65 yo.   HPI: Menses are heavy uses a pad Q2hours the first two days.  Has 3 lighter days after. Has missed two periods in the last year. Has noticed occasional spotting after a period a few months. Having SUI and vaginal dryness and hot flashes.  Pap Neg 02/2021. Lichen sclerosus of vulva well on Clobetasol ointment when needed.  BMs normal.   Mammo Neg 02/2023 with biopsy.  Not exercising regularly. Health labs with Fam MD. Alric Seton found at cesarean delivery. Dxa: referral placed for baseline COLONOSCOPY: 07-13-19  [facility] 06/08/23 112 Dxa: to get baseline  PUS with 9.97 cm anteverted uterus Normal size and shape No myometrial masses  Cervix: at least one 9.9 x 5.19mm possible polyp with feeder vessel seen (3D imaging questions two other areas or polyps) Endometrial lining: 11mm avascular area seen ub Fundal region ?polyp  Both ovaries normal size with normal follicle pattern  No adnexal masses No free fluid  There is no height or weight on file to calculate BMI.'  There is no height or weight on file to calculate BMI.     06/08/2023   11:57 AM 08/01/2021    3:39 PM 08/01/2020    3:37 PM  Depression screen PHQ 2/9  Decreased Interest 0 0 0  Down, Depressed, Hopeless 0 0 0  PHQ - 2 Score 0 0 0    Blood pressure 112/78, pulse 86, last menstrual period 04/22/2023, SpO2 99%.     Component Value Date/Time   DIAGPAP  06/08/2023 1237    - Negative for intraepithelial lesion or malignancy (NILM)   DIAGPAP  03/06/2021 1701    - Negative for Intraepithelial Lesions or Malignancy (NILM)   DIAGPAP - Benign reactive/reparative changes  03/06/2021 1701   HPVHIGH Negative 03/06/2021 1701   ADEQPAP  06/08/2023 1237    Satisfactory for evaluation; transformation zone component PRESENT.   ADEQPAP  03/06/2021 1701    Satisfactory for evaluation; transformation zone component PRESENT.    GYN HISTORY:    Component Value Date/Time   DIAGPAP  06/08/2023 1237    - Negative for intraepithelial lesion or malignancy (NILM)   DIAGPAP  03/06/2021 1701    - Negative for Intraepithelial Lesions or Malignancy (NILM)   DIAGPAP - Benign reactive/reparative changes 03/06/2021 1701   HPVHIGH Negative 03/06/2021 1701   ADEQPAP  06/08/2023 1237    Satisfactory for evaluation; transformation zone component PRESENT.   ADEQPAP  03/06/2021 1701    Satisfactory for evaluation; transformation zone component PRESENT.    OB History  Gravida Para Term Preterm AB Living  3 3 3   4   SAB IAB Ectopic Multiple Live Births     1     # Outcome Date GA Lbr Len/2nd Weight Sex Type Anes PTL Lv  3A Term           3B Term           2 Term           1 Term             Past Medical History:  Diagnosis  Date   Allergy    B12 deficiency anemia    Cancer (HCC)    melanoma    Melanoma (HCC)    PONV (postoperative nausea and vomiting)     Past Surgical History:  Procedure Laterality Date   CESAREAN SECTION     LEFT OVARIAN ENDOMETRIOMA AND BTSP   COLONOSCOPY W/ POLYPECTOMY  2021   Melanoma excised     TUBAL LIGATION     AT TIME OF C/S   WISDOM TOOTH EXTRACTION      Current Outpatient Medications on File Prior to Visit  Medication Sig Dispense Refill   clobetasol ointment (TEMOVATE) 0.05 % Apply 1 Application topically 2 (two) times a week. Thin Vulvar application 30 g 4   Fexofenadine HCl (ALLEGRA PO) Take by mouth as needed.     fluticasone (FLONASE) 50 MCG/ACT nasal spray Place 2 sprays into both nostrils daily. 16 g 3   INTRAROSA 6.5 MG INST Place 1 suppository vaginally daily.     IRON-FA-B CMP-C-BIOT-PROBIOTIC PO Take by mouth daily.      SYRINGE/NEEDLE, DISP, 1 ML (B-D SYRINGE/NEEDLE 1CC/25GX5/8) 25G X 5/8" 1 ML MISC 1 SQ injection once weekly 100 each 1   Vitamin D, Ergocalciferol, (DRISDOL) 1.25 MG (50000 UNIT) CAPS capsule TAKE 1 CAPSULE BY MOUTH TWICE WEEKLY FOR VITD DEFICIENCY 36 capsule 1   ferrous sulfate 325 (65 FE) MG EC tablet Take 1 tablet (325 mg total) by mouth 2 (two) times daily. 60 tablet 2   No current facility-administered medications on file prior to visit.    Social History   Socioeconomic History   Marital status: Married    Spouse name: Not on file   Number of children: Not on file   Years of education: Not on file   Highest education level: Not on file  Occupational History   Not on file  Tobacco Use   Smoking status: Former    Current packs/day: 0.00    Average packs/day: 1 pack/day for 12.0 years (12.0 ttl pk-yrs)    Types: Cigarettes    Start date: 03/21/1986    Quit date: 03/21/1998    Years since quitting: 25.3   Smokeless tobacco: Never  Vaping Use   Vaping status: Never Used  Substance and Sexual Activity   Alcohol use: Yes    Alcohol/week: 1.0 standard drink of alcohol    Types: 1 Standard drinks or equivalent per week   Drug use: No   Sexual activity: Not Currently    Partners: Male    Birth control/protection: Surgical, Abstinence    Comment: BTL, First IC >66 y/o  Other Topics Concern   Not on file  Social History Narrative   Not on file   Social Drivers of Health   Financial Resource Strain: Not on file  Food Insecurity: Not on file  Transportation Needs: Not on file  Physical Activity: Not on file  Stress: Not on file  Social Connections: Not on file  Intimate Partner Violence: Not on file    Family History  Problem Relation Age of Onset   Hypertension Mother    Stroke Mother        TIA   Colon polyps Mother    Melanoma Mother    Hypertension Father    Diabetes Father    Colon polyps Father    Cancer Maternal Grandmother        OVARIAN/ leukemia    Heart disease Maternal Grandfather    Cancer Paternal Grandmother  Brain tumor meningioma   Cancer Paternal Grandfather        Lung     No Known Allergies    Patient's last menstrual period was Patient's last menstrual period was 04/22/2023 (approximate)..            Review of Systems Alls systems reviewed and are negative.    from last visit: Physical Exam Constitutional:      Appearance: Normal appearance.  Genitourinary:     Vulva and urethral meatus normal.     No lesions in the vagina.     Right Labia: No rash, lesions or skin changes.    Left Labia: No lesions, skin changes or rash.    No vaginal discharge or tenderness.     No vaginal prolapse present.    No vaginal atrophy present.     Right Adnexa: not tender, not palpable and no mass present.    Left Adnexa: not tender, not palpable and no mass present.    No cervical motion tenderness or discharge.     Uterus is irregular.     Uterus is not tender.  Breasts:    Right: Normal.     Left: Normal.  HENT:     Head: Normocephalic.  Neck:     Thyroid: No thyroid mass, thyromegaly or thyroid tenderness.  Cardiovascular:     Rate and Rhythm: Normal rate and regular rhythm.     Heart sounds: Normal heart sounds, S1 normal and S2 normal.  Pulmonary:     Effort: Pulmonary effort is normal.     Breath sounds: Normal breath sounds and air entry.  Abdominal:     General: There is no distension.     Palpations: Abdomen is soft. There is no mass.     Tenderness: There is no abdominal tenderness. There is no guarding or rebound.  Musculoskeletal:        General: Normal range of motion.     Cervical back: Full passive range of motion without pain, normal range of motion and neck supple. No tenderness.     Right lower leg: No edema.     Left lower leg: No edema.  Neurological:     Mental Status: She is alert.  Skin:    General: Skin is warm.  Psychiatric:        Mood and Affect: Mood normal.         Behavior: Behavior normal.        Thought Content: Thought content normal.  Vitals and nursing note reviewed. Exam conducted with a chaperone present.       A:         Well Woman GYN exam, menorrhagia, DUB, endometriosis, endometrial polyps                             P:        Counseled on the r/b/a/I of HRT use.  Discussed that she will need progesterone to avoid unopposed estrogen on the endometrial lining and risk for endometrial cancer. Discussed lower risk for DVT and stroke with the patch. Side effects include risk of breast tenderness and spotting along with low risk of blood clots and stroke with uncontrolled hypertension. Counseled on the benefits to help improve the bone density during menopause. To begin low dose estrogen patch and prometrium at bedtime. May continue intrarosa.  PUS results were reviewed in detail with patient. Counseled on the importance of removing the polyps  and ensuring they are benign with pathology. Patient agreed. The hysteroscopy with the [facility], ECC was reviewed and discussed in detail with the patient.  The risk of bleeding, infection, injury to surrounding structures, uterine perforation with risk of additional procedure laparoscopy/laparotomy were all reviewed discussed with patient.  Risk for blood clots was discussed.  Discussed risk for bleeding after the procedure anywhere from 7 to 10 days.  She will need a driver to and from surgery as well.  Pathology results will take about 2 weeks to return.  30 minutes spent on reviewing records, imaging,  and one on one patient time and counseling patient and documentation Dr. Karma Greaser   No follow-ups on file.  Earley Favor

## 2023-07-10 ENCOUNTER — Encounter: Payer: Self-pay | Admitting: Obstetrics and Gynecology

## 2023-07-13 NOTE — Telephone Encounter (Signed)
 Per EB:  "Can you send to [facility]  Dr. Karma Greaser"

## 2023-07-15 NOTE — Telephone Encounter (Signed)
 Order for DXA authorized and faxed successfully to [facility] on 07/13/2023.

## 2023-07-22 ENCOUNTER — Encounter: Payer: Self-pay | Admitting: Obstetrics and Gynecology

## 2023-07-22 ENCOUNTER — Encounter: Payer: Self-pay | Admitting: Nurse Practitioner

## 2023-07-22 DIAGNOSIS — N8110 Cystocele, unspecified: Secondary | ICD-10-CM

## 2023-07-23 ENCOUNTER — Encounter: Payer: Self-pay | Admitting: *Deleted

## 2023-07-28 ENCOUNTER — Encounter (HOSPITAL_COMMUNITY): Payer: Self-pay | Admitting: Obstetrics and Gynecology

## 2023-07-28 NOTE — Progress Notes (Signed)
 Spoke w/ via phone for pre-op interview--- Anna Hernandez Lab needs dos----  T&S, CBC and UPT per surgeon.       Lab results------ COVID test -----patient states asymptomatic no test needed Arrive at -------0845 NPO after MN NO Solid Food.  Clear liquids from MN until---0745 Pre-Surgery Ensure or G2:  Med rec completed Medications to take morning of surgery -----NONE Diabetic medication -----  GLP1 agonist last dose: GLP1 instructions:  Patient instructed no nail polish to be worn day of surgery Patient instructed to bring photo id and insurance card day of surgery Patient aware to have Driver (ride ) / caregiver    for 24 hours after surgery - Mother Anna Hernandez Patient Special Instructions ----- Shower with antibacterial soap. Pre-Op special Instructions -----  Patient verbalized understanding of instructions that were given at this phone interview. Patient denies chest pain, sob, fever, cough at the interview.

## 2023-07-29 ENCOUNTER — Telehealth: Payer: Self-pay

## 2023-07-29 NOTE — Telephone Encounter (Signed)
 Prior authorization needed for intrarosa  6.5 vag insert. Patient aware it could take several days for a response. Patient said the local pharmacy is also working on trying to use her coupon. Key: [id number]

## 2023-07-30 NOTE — Telephone Encounter (Signed)
 Patient is aware PA was denied. She will work with her pharmacy with using the coupon again.

## 2023-08-04 ENCOUNTER — Ambulatory Visit (HOSPITAL_COMMUNITY): Admitting: Anesthesiology

## 2023-08-04 ENCOUNTER — Ambulatory Visit (HOSPITAL_COMMUNITY)
Admission: RE | Admit: 2023-08-04 | Discharge: 2023-08-04 | Disposition: A | Discharge status: Home / Self Care | Attending: Obstetrics and Gynecology | Admitting: Obstetrics and Gynecology

## 2023-08-04 ENCOUNTER — Other Ambulatory Visit: Payer: Self-pay

## 2023-08-04 ENCOUNTER — Encounter (HOSPITAL_COMMUNITY)
Admission: RE | Disposition: A | Discharge status: Home / Self Care | Payer: Self-pay | Source: Home / Self Care | Attending: Obstetrics and Gynecology

## 2023-08-04 ENCOUNTER — Ambulatory Visit (HOSPITAL_BASED_OUTPATIENT_CLINIC_OR_DEPARTMENT_OTHER): Admitting: Anesthesiology

## 2023-08-04 ENCOUNTER — Encounter (HOSPITAL_COMMUNITY): Payer: Self-pay | Admitting: Obstetrics and Gynecology

## 2023-08-04 DIAGNOSIS — Z87891 Personal history of nicotine dependence: Secondary | ICD-10-CM | POA: Diagnosis not present

## 2023-08-04 DIAGNOSIS — N840 Polyp of corpus uteri: Secondary | ICD-10-CM

## 2023-08-04 DIAGNOSIS — N950 Postmenopausal bleeding: Secondary | ICD-10-CM | POA: Insufficient documentation

## 2023-08-04 DIAGNOSIS — Z01818 Encounter for other preprocedural examination: Secondary | ICD-10-CM

## 2023-08-04 HISTORY — PX: DILATATION & CURRETTAGE/HYSTEROSCOPY WITH RESECTOCOPE: SHX5572

## 2023-08-04 LAB — CBC
HCT: 38.6 % (ref 36.0–46.0)
Hemoglobin: 12.2 g/dL (ref 12.0–15.0)
MCH: 27.4 pg (ref 26.0–34.0)
MCHC: 31.6 g/dL (ref 30.0–36.0)
MCV: 86.7 fL (ref 80.0–100.0)
Platelets: 311 10*3/uL (ref 150–400)
RBC: 4.45 MIL/uL (ref 3.87–5.11)
RDW: 17.4 % — ABNORMAL HIGH (ref 11.5–15.5)
WBC: 4 10*3/uL (ref 4.0–10.5)
nRBC: 0 % (ref 0.0–0.2)

## 2023-08-04 LAB — POCT PREGNANCY, URINE: Preg Test, Ur: NEGATIVE

## 2023-08-04 LAB — TYPE AND SCREEN
ABO/RH(D): O POS
Antibody Screen: NEGATIVE

## 2023-08-04 SURGERY — DILATATION & CURETTAGE/HYSTEROSCOPY WITH RESECTOCOPE
Anesthesia: General | Site: Uterus

## 2023-08-04 MED ORDER — ORAL CARE MOUTH RINSE: 15.0000 mL | Freq: Once | OROMUCOSAL | Status: AC

## 2023-08-04 MED ORDER — CHLORHEXIDINE GLUCONATE 0.12 % MT SOLN
15.0000 mL | Freq: Once | OROMUCOSAL | Status: AC
  Administered 2023-08-04: 15 mL via OROMUCOSAL
  Filled 2023-08-04: qty 15

## 2023-08-04 MED ORDER — MONSELS FERRIC SUBSULFATE EX SOLN
CUTANEOUS | Status: AC
  Filled 2023-08-04: qty 8

## 2023-08-04 MED ORDER — SODIUM CHLORIDE 0.9 % IR SOLN
Status: DC | PRN
  Administered 2023-08-04: 1

## 2023-08-04 MED ORDER — MIDAZOLAM HCL 2 MG/2ML IJ SOLN
INTRAMUSCULAR | Status: DC | PRN
  Administered 2023-08-04: 2 mg via INTRAVENOUS

## 2023-08-04 MED ORDER — POVIDONE-IODINE 10 % EX SWAB
2.0000 | Freq: Once | CUTANEOUS | Status: DC
Start: 2023-08-04 — End: 2023-08-04

## 2023-08-04 MED ORDER — MIDAZOLAM HCL 2 MG/2ML IJ SOLN
INTRAMUSCULAR | Status: AC
  Filled 2023-08-04: qty 2

## 2023-08-04 MED ORDER — AMISULPRIDE (ANTIEMETIC) 5 MG/2ML IV SOLN: 10.0000 mg | Freq: Once | INTRAVENOUS | Status: DC | PRN

## 2023-08-04 MED ORDER — ACETAMINOPHEN 500 MG PO TABS: 1000.0000 mg | ORAL_TABLET | ORAL | Status: AC

## 2023-08-04 MED ORDER — SILVER NITRATE-POT NITRATE 75-25 % EX MISC
CUTANEOUS | Status: AC
  Filled 2023-08-04: qty 10

## 2023-08-04 MED ORDER — FENTANYL CITRATE (PF) 250 MCG/5ML IJ SOLN
INTRAMUSCULAR | Status: DC | PRN
  Administered 2023-08-04 (×2): 50 ug via INTRAVENOUS

## 2023-08-04 MED ORDER — LACTATED RINGERS IV SOLN: INTRAVENOUS | Status: DC

## 2023-08-04 MED ORDER — FENTANYL CITRATE (PF) 100 MCG/2ML IJ SOLN: 25.0000 ug | INTRAMUSCULAR | Status: DC | PRN

## 2023-08-04 MED ORDER — FENTANYL CITRATE (PF) 250 MCG/5ML IJ SOLN
INTRAMUSCULAR | Status: AC
  Filled 2023-08-04: qty 5

## 2023-08-04 MED ORDER — ONDANSETRON HCL 4 MG/2ML IJ SOLN: 4.0000 mg | Freq: Once | INTRAMUSCULAR | Status: DC | PRN

## 2023-08-04 MED ORDER — MONSELS FERRIC SUBSULFATE EX SOLN
CUTANEOUS | Status: DC | PRN
  Administered 2023-08-04: 1 via TOPICAL

## 2023-08-04 MED ORDER — SCOPOLAMINE 1 MG/3DAYS TD PT72
MEDICATED_PATCH | TRANSDERMAL | Status: AC
  Filled 2023-08-04: qty 1

## 2023-08-04 MED ORDER — SCOPOLAMINE 1 MG/3DAYS TD PT72
1.0000 | MEDICATED_PATCH | Freq: Once | TRANSDERMAL | Status: DC
  Administered 2023-08-04: 1.5 mg via TRANSDERMAL

## 2023-08-04 MED ORDER — PROPOFOL 500 MG/50ML IV EMUL
INTRAVENOUS | Status: DC | PRN
  Administered 2023-08-04: 150 ug/kg/min via INTRAVENOUS

## 2023-08-04 MED ORDER — ACETAMINOPHEN 500 MG PO TABS
ORAL_TABLET | ORAL | Status: AC
  Administered 2023-08-04: 1000 mg via ORAL
  Filled 2023-08-04: qty 2

## 2023-08-04 SURGICAL SUPPLY — 14 items
DEVICE MYOSURE LITE (MISCELLANEOUS) IMPLANT
DEVICE MYOSURE REACH (MISCELLANEOUS) IMPLANT
DILATOR CANAL MILEX (MISCELLANEOUS) IMPLANT
GLOVE NEODERM STER SZ 7 (GLOVE) ×1 IMPLANT
GLOVE SURG UNDER POLY LF SZ7 (GLOVE) ×1 IMPLANT
GOWN STRL REUS W/ TWL XL LVL3 (GOWN DISPOSABLE) ×1 IMPLANT
KIT PROCEDURE FLUENT (KITS) ×1 IMPLANT
KIT TURNOVER KIT B (KITS) ×1 IMPLANT
PACK VAGINAL MINOR WOMEN LF (CUSTOM PROCEDURE TRAY) ×1 IMPLANT
PAD OB MATERNITY 11 LF (PERSONAL CARE ITEMS) ×1 IMPLANT
SCOPETTES 8 STERILE (MISCELLANEOUS) ×1 IMPLANT
SEAL ROD LENS SCOPE MYOSURE (ABLATOR) ×1 IMPLANT
TOWEL GREEN STERILE FF (TOWEL DISPOSABLE) ×1 IMPLANT
UNDERPAD 30X36 HEAVY ABSORB (UNDERPADS AND DIAPERS) ×1 IMPLANT

## 2023-08-04 NOTE — Anesthesia Preprocedure Evaluation (Addendum)
 Anesthesia Evaluation  Patient identified by MRN, date of birth, ID band Patient awake    Reviewed: Allergy & Precautions, NPO status , Patient's Chart, lab work & pertinent test results  History of Anesthesia Complications (+) PONV and history of anesthetic complications  Airway Mallampati: II  TM Distance: >3 FB Neck ROM: Full    Dental  (+) Teeth Intact, Dental Advisory Given   Pulmonary former smoker   Pulmonary exam normal breath sounds clear to auscultation       Cardiovascular negative cardio ROS Normal cardiovascular exam Rhythm:Regular Rate:Normal     Neuro/Psych negative neurological ROS  negative psych ROS   GI/Hepatic negative GI ROS, Neg liver ROS,,,  Endo/Other  negative endocrine ROS    Renal/GU negative Renal ROS     Musculoskeletal negative musculoskeletal ROS (+)    Abdominal   Peds  Hematology negative hematology ROS (+)   Anesthesia Other Findings Day of surgery medications reviewed with the patient.  H/o melanoma   Reproductive/Obstetrics Endometrial polyp PMB                               Anesthesia Physical Anesthesia Plan  ASA: 2  Anesthesia Plan: General   Post-op Pain Management:    Induction: Intravenous  PONV Risk Score and Plan: 4 or greater and Midazolam, Dexamethasone, Ondansetron, TIVA and Scopolamine patch - Pre-op  Airway Management Planned: LMA  Additional Equipment:   Intra-op Plan:   Post-operative Plan: Extubation in OR  Informed Consent: I have reviewed the patients History and Physical, chart, labs and discussed the procedure including the risks, benefits and alternatives for the proposed anesthesia with the patient or authorized representative who has indicated his/her understanding and acceptance.     Dental advisory given  Plan Discussed with: CRNA  Anesthesia Plan Comments:        Anesthesia Quick Evaluation

## 2023-08-04 NOTE — Anesthesia Postprocedure Evaluation (Addendum)
 Anesthesia Post Note  Patient: Deina Brimberry Humphrey-Messer  Procedure(s) Performed: DILATATION & CURETTAGE/HYSTEROSCOPY WITH MYOSURE (Uterus)     Patient location during evaluation: PACU Anesthesia Type: MAC Level of consciousness: awake and alert Pain management: pain level controlled Vital Signs Assessment: post-procedure vital signs reviewed and stable Respiratory status: spontaneous breathing, nonlabored ventilation and respiratory function stable Cardiovascular status: blood pressure returned to baseline and stable Postop Assessment: no apparent nausea or vomiting Anesthetic complications: no   There were no known notable events for this encounter.  Last Vitals:  Vitals:   08/04/23 1300 08/04/23 1309  BP: 115/61 101/66  Pulse: (!) 58 (!) 54  Resp: 13 13  Temp:  36.7 C  SpO2: 100% 100%    Last Pain:  Vitals:   08/04/23 1309  TempSrc:   PainSc: 0-No pain                 Erin Havers

## 2023-08-04 NOTE — Transfer of Care (Signed)
 Immediate Anesthesia Transfer of Care Note  Patient: Anna Hernandez  Procedure(s) Performed: DILATATION & CURETTAGE/HYSTEROSCOPY WITH MYOSURE (Uterus)  Patient Location: PACU  Anesthesia Type:MAC  Level of Consciousness: awake  Airway & Oxygen Therapy: Patient connected to face mask oxygen  Post-op Assessment: Post -op Vital signs reviewed and stable  Post vital signs: stable  Last Vitals:  Vitals Value Taken Time  BP 101/55 08/04/23 1233  Temp    Pulse 70 08/04/23 1235  Resp 12 08/04/23 1235  SpO2 96 % 08/04/23 1235  Vitals shown include unfiled device data.  Last Pain:  Vitals:   08/04/23 0921  TempSrc: Oral  PainSc: 0-No pain      Patients Stated Pain Goal: 4 (08/04/23 0921)  Complications: No notable events documented.

## 2023-08-04 NOTE — Op Note (Signed)
 08/04/2023 Anna Hernandez [id number]    OPERATIVE REPORT  Preop Diagnosis: post menopausal bleeding, thickened endometrial lining  Post operative diagnosis: same  Procedure: Myosure hysteroscopy Dilation and curettage, endocervical curettage  Surgeon: Dr. Sharol Decamp Assistant: none  Fluids: please see anesthesia report Fluid deficit: 130cc  Complications: None Anesthesia: MAC   Findings: Small appearing cervix and uterine cavity measuring 7cm with both ostia seen.  A small submucosal fibroid was seen and removed.  Stage 2-3 cystocele was noted  Estimated blood loss: Minimal  Specimens: Endometrial curettings with submucosal fibroid, ECC  Disposition of specimen: Pathology    Procedure: Patient was taken to the OR where she was placed in dorsal lithotomy in Allen stirrups. SCDs were in place.  The patient was prepped and draped in the usual sterile fashion. An adequate timeout was obtained and everyone agreed. A red rubber catheter is used to drain her bladder. A bivalve speculum was placed inside the vagina and the cervix visualized. The cervix was grasped anteriorly with a single-tooth tenaculum.  The uterus sounded to 7 cm. Sequential dilation was done to a #8 dilator, and the myosure hysteroscope was introduced into the uterine cavity. The cervix and endometrial lining appeared normal both ostia were seen there was no deformity of the cavity. A seal test was done and was passed. A small polyp was seen and removed with the myosure light.  This was also used to sample the entire cavity and remove as much of the submucosal fibroid as possible.  The fibroid extended into the muscle.  A sharp curettage was then performed to ensure complete sampling of the cavity. and was sent to pathology. All instrument, sponge and lap counts were correct x2. The patient was awakened taken to recovery room in stable condition. Reinaldo Caras MD 08/04/2023 2:32 PM

## 2023-08-04 NOTE — Interval H&P Note (Signed)
 History and Physical Interval Note:  08/04/2023 11:26 AM  Anna Hernandez  has presented today for surgery, with the diagnosis of endometrial polyp, POSTMENOPAUSAL BLEEDING.  The various methods of treatment have been discussed with the patient and family. After consideration of risks, benefits and other options for treatment, the patient has consented to  Procedure(s) with comments: DILATATION & CURETTAGE/HYSTEROSCOPY WITH RESECTOCOPE (N/A) - MYOSURE and ECC as a surgical intervention.  The patient's history has been reviewed, patient examined, no change in status, stable for surgery.  I have reviewed the patient's chart and labs.  Questions were answered to the patient's satisfaction.     Reinaldo Caras

## 2023-08-04 NOTE — Discharge Instructions (Addendum)
     No acetaminophen/Tylenol until after 3:30 pm today if needed.     Post Anesthesia Home Care Instructions  Activity: Get plenty of rest for the remainder of the day. A responsible individual must stay with you for 24 hours following the procedure.  For the next 24 hours, DO NOT: -Drive a car -Advertising copywriter -Drink alcoholic beverages -Take any medication unless instructed by your physician -Make any legal decisions or sign important papers.  Meals: Start with liquid foods such as gelatin or soup. Progress to regular foods as tolerated. Avoid greasy, spicy, heavy foods. If nausea and/or vomiting occur, drink only clear liquids until the nausea and/or vomiting subsides. Call your physician if vomiting continues.  Special Instructions/Symptoms: Your throat may feel dry or sore from the anesthesia or the breathing tube placed in your throat during surgery. If this causes discomfort, gargle with warm salt water. The discomfort should disappear within 24 hours.  If you had a scopolamine patch placed behind your ear for the management of post- operative nausea and/or vomiting:  1. The medication in the patch is effective for 72 hours, after which it should be removed.  Wrap patch in a tissue and discard in the trash. Wash hands thoroughly with soap and water. 2. You may remove the patch earlier than 72 hours if you experience unpleasant side effects which may include dry mouth, dizziness or visual disturbances. 3. Avoid touching the patch. Wash your hands with soap and water after contact with the patch.

## 2023-08-05 ENCOUNTER — Encounter (HOSPITAL_COMMUNITY): Payer: Self-pay | Admitting: Obstetrics and Gynecology

## 2023-08-05 ENCOUNTER — Encounter: Payer: Self-pay | Admitting: Obstetrics and Gynecology

## 2023-08-05 LAB — SURGICAL PATHOLOGY

## 2023-08-13 ENCOUNTER — Ambulatory Visit: Admitting: Urgent Care

## 2023-08-13 ENCOUNTER — Encounter: Payer: Self-pay | Admitting: Urgent Care

## 2023-08-13 VITALS — BP 112/76 | HR 71 | Ht 64.0 in | Wt 153.4 lb

## 2023-08-13 DIAGNOSIS — E663 Overweight: Secondary | ICD-10-CM | POA: Diagnosis not present

## 2023-08-13 DIAGNOSIS — D508 Other iron deficiency anemias: Secondary | ICD-10-CM

## 2023-08-13 DIAGNOSIS — Z0001 Encounter for general adult medical examination with abnormal findings: Secondary | ICD-10-CM

## 2023-08-13 DIAGNOSIS — E559 Vitamin D deficiency, unspecified: Secondary | ICD-10-CM

## 2023-08-13 DIAGNOSIS — Z23 Encounter for immunization: Secondary | ICD-10-CM | POA: Diagnosis not present

## 2023-08-13 DIAGNOSIS — E538 Deficiency of other specified B group vitamins: Secondary | ICD-10-CM | POA: Diagnosis not present

## 2023-08-13 DIAGNOSIS — Z0000 Encounter for general adult medical examination without abnormal findings: Secondary | ICD-10-CM

## 2023-08-13 LAB — LIPID PANEL
Cholesterol: 196 mg/dL (ref 0–200)
HDL: 61.7 mg/dL (ref >39.00)
LDL Cholesterol: 122 mg/dL — ABNORMAL HIGH (ref 0–99)
NonHDL: 134.17
Total CHOL/HDL Ratio: 3
Triglycerides: 59 mg/dL (ref 0.0–149.0)
VLDL: 11.8 mg/dL (ref 0.0–40.0)

## 2023-08-13 LAB — CBC WITH DIFFERENTIAL/PLATELET
Basophils Absolute: 0 10*3/uL (ref 0.0–0.1)
Basophils Relative: 0.9 % (ref 0.0–3.0)
Eosinophils Absolute: 0.1 10*3/uL (ref 0.0–0.7)
Eosinophils Relative: 2.7 % (ref 0.0–5.0)
HCT: 38.7 % (ref 36.0–46.0)
Hemoglobin: 12.3 g/dL (ref 12.0–15.0)
Lymphocytes Relative: 34.8 % (ref 12.0–46.0)
Lymphs Abs: 1.6 10*3/uL (ref 0.7–4.0)
MCHC: 31.8 g/dL (ref 30.0–36.0)
MCV: 85.6 fl (ref 78.0–100.0)
Monocytes Absolute: 0.5 10*3/uL (ref 0.1–1.0)
Monocytes Relative: 10.3 % (ref 3.0–12.0)
Neutro Abs: 2.3 10*3/uL (ref 1.4–7.7)
Neutrophils Relative %: 51.3 % (ref 43.0–77.0)
Platelets: 312 10*3/uL (ref 150.0–400.0)
RBC: 4.52 Mil/uL (ref 3.87–5.11)
RDW: 18.5 % — ABNORMAL HIGH (ref 11.5–15.5)
WBC: 4.5 10*3/uL (ref 4.0–10.5)

## 2023-08-13 LAB — COMPREHENSIVE METABOLIC PANEL WITH GFR
ALT: 12 U/L (ref 0–35)
AST: 16 U/L (ref 0–37)
Albumin: 4.6 g/dL (ref 3.5–5.2)
Alkaline Phosphatase: 70 U/L (ref 39–117)
BUN: 14 mg/dL (ref 6–23)
CO2: 26 meq/L (ref 19–32)
Calcium: 9.2 mg/dL (ref 8.4–10.5)
Chloride: 105 meq/L (ref 96–112)
Creatinine, Ser: 0.68 mg/dL (ref 0.40–1.20)
GFR: 98.37 mL/min (ref >60.00)
Glucose, Bld: 92 mg/dL (ref 70–99)
Potassium: 4.4 meq/L (ref 3.5–5.1)
Sodium: 138 meq/L (ref 135–145)
Total Bilirubin: 0.3 mg/dL (ref 0.2–1.2)
Total Protein: 7.4 g/dL (ref 6.0–8.3)

## 2023-08-13 LAB — VITAMIN D 25 HYDROXY (VIT D DEFICIENCY, FRACTURES): VITD: 33.16 ng/mL (ref 30.00–100.00)

## 2023-08-13 LAB — HEMOGLOBIN A1C: Hgb A1c MFr Bld: 5.5 % (ref 4.6–6.5)

## 2023-08-13 LAB — TSH: TSH: 1.21 u[IU]/mL (ref 0.35–5.50)

## 2023-08-13 LAB — B12 AND FOLATE PANEL
Folate: 10.3 ng/mL (ref >5.9)
Vitamin B-12: 110 pg/mL — ABNORMAL LOW (ref 211–911)

## 2023-08-13 NOTE — Patient Instructions (Addendum)
 Your wellness visit was completed today. Labs were updated.  Your first shingles vaccine was administered. Return in 2 months to obtain the second one.  Return annually, or sooner as needed.  To

## 2023-08-13 NOTE — Progress Notes (Signed)
 Annual Wellness Visit     Patient: Anna Hernandez, Female    DOB: June 08, 1970, 53 y.o.   MRN: [id number]  Subjective  Chief Complaint  Patient presents with   Establish Care    New pt est care.    Anna Hernandez is a 53 y.o. female who presents today for her Annual Wellness Visit. She reports consuming a general diet. Gym/ health club routine includes light weights and walking on track . She generally feels fairly well. She reports sleeping well. She does not have additional problems to discuss today.   HPI  Discussed the use of AI scribe software for clinical note transcription with the patient, who gave verbal consent to proceed.  History of Present Illness   Grabriela Scheller Hernandez is a 53 year old female who presents for an annual physical exam.  She recently underwent a women's annual exam two months ago, during which two polyps were removed and found to be normal. She has a history of tubular adenomatous polyp from a prior colonoscopy. Her last DEXA scan was normal, and her mammogram six months ago required a biopsy, which was normal. She is due for the Shingrix  vaccine. She has a history of melanoma and follows up with a dermatologist annually.  She experiences heavy menstrual bleeding with large blood clots, previously thought to be related to the polyps. Since the procedure three weeks ago, she has not experienced any complications. Her last menstrual period was in April, after skipping February and March, indicating she is in the perimenopausal stage. She has recently started on an estrogen patch and progesterone .  She has been on a prescription vitamin D  supplement of 50,000 units once a week for at least a year or two, which has stabilized her levels. She also takes an iron probiotic multivitamin, Flonase , and Allegra daily. She uses Temovate  ointment as needed for dryness in the groin area.  She quit smoking 25 years ago after smoking for 12 years and  consumes about one alcoholic drink per week. 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 She aims to lose weight, having already lost 7 pounds since December, with a goal to reach 145 pounds by the end of the year. She perceives her health as 'fair' due to her diet and exercise habits. She is trying to improve her physical activity by walking daily and using weights at work.  No significant sleep issues, although she wakes up two to three times a night to use the restroom. She tries to limit fluid intake before bed. She has routine eye and dental screenings annually and every six months, respectively. She has a history of B12 deficiency, which was managed with shots and was previously normal.      Vision:Within last year and Dental: No current dental problems and Receives regular dental care   Patient Active Problem List   Diagnosis Date Noted   Constipation 08/01/2021   Overweight (BMI 25.0-29.9) 08/01/2020   History of adenomatous polyp of colon 08/01/2020   Iron deficiency anemia 08/01/2020   B12 deficiency 08/01/2020   Hyperlipidemia 08/01/2020   Calcium oxalate crystals in urine 02/04/2018   Allergic rhinitis 03/21/2013   Melanoma (HCC) 05/14/2012   Past Medical History:  Diagnosis Date   Allergy    Seasonal   B12 deficiency anemia    Cancer (HCC)    melanoma    Melanoma (HCC)    PONV (postoperative nausea and  vomiting)    Past Surgical History:  Procedure Laterality Date   CESAREAN SECTION     LEFT OVARIAN ENDOMETRIOMA AND BTSP   COLONOSCOPY W/ POLYPECTOMY  2021   DILATATION & CURRETTAGE/HYSTEROSCOPY WITH RESECTOCOPE N/A 08/04/2023   Procedure: DILATATION & CURETTAGE/HYSTEROSCOPY WITH MYOSURE;  Surgeon: Reinaldo Caras, MD;  Location: [facility] OR;  Service: Gynecology;  Laterality: N/A;  MYOSURE and ECC   Melanoma excised     TUBAL LIGATION     AT TIME OF C/S   WISDOM TOOTH EXTRACTION     Social History   Socioeconomic History    Marital status: Married    Spouse name: Not on file   Number of children: Not on file   Years of education: Not on file   Highest education level: Associate degree: occupational, Scientist, product/process development, or vocational program  Occupational History   Not on file  Tobacco Use   Smoking status: Former    Current packs/day: 0.00    Average packs/day: 1 pack/day for 12.0 years (12.0 ttl pk-yrs)    Types: Cigarettes    Start date: 03/21/1986    Quit date: 03/21/1998    Years since quitting: 25.4   Smokeless tobacco: Never  Vaping Use   Vaping status: Never Used  Substance and Sexual Activity   Alcohol use: Yes    Alcohol/week: 1.0 standard drink of alcohol    Types: 1 Standard drinks or equivalent per week   Drug use: No   Sexual activity: Not Currently    Partners: Male    Birth control/protection: Abstinence, Surgical    Comment: BTL, First IC >81 y/o  Other Topics Concern   Not on file  Social History Narrative   Not on file   Social Drivers of Health   Financial Resource Strain: Low Risk  (08/12/2023)   Overall Financial Resource Strain (CARDIA)    Difficulty of Paying Living Expenses: Not hard at all  Food Insecurity: No Food Insecurity (08/12/2023)   Hunger Vital Sign    Worried About Running Out of Food in the Last Year: Never true    Ran Out of Food in the Last Year: Never true  Transportation Needs: No Transportation Needs (08/12/2023)   PRAPARE - Administrator, Civil Service (Medical): No    Lack of Transportation (Non-Medical): No  Physical Activity: Insufficiently Active (08/12/2023)   Exercise Vital Sign    Days of Exercise per Week: 3 days    Minutes of Exercise per Session: 40 min  Stress: No Stress Concern Present (08/12/2023)   [employer] of Occupational Health - Occupational Stress Questionnaire    Feeling of Stress : Only a little  Social Connections: Socially Integrated (08/12/2023)   Social Connection and Isolation Panel [NHANES]    Frequency of  Communication with Friends and Family: More than three times a week    Frequency of Social Gatherings with Friends and Family: More than three times a week    Attends Religious Services: More than 4 times per year    Active Member of [employer] or Organizations: Yes    Attends Engineer, structural: More than 4 times per year    Marital Status: Married  Catering manager Violence: Not on file      Medications: Outpatient Medications Prior to Visit  Medication Sig   clobetasol  ointment (TEMOVATE ) 0.05 % Apply 1 Application topically 2 (two) times a week. Thin Vulvar application   estradiol  (DOTTI ) 0.0375 MG/24HR Place 1 patch onto the skin 2 (  two) times a week.   Fexofenadine HCl (ALLEGRA PO) Take by mouth as needed.   fluticasone  (FLONASE ) 50 MCG/ACT nasal spray Place 2 sprays into both nostrils daily.   INTRAROSA  6.5 MG INST Place 1 suppository vaginally daily.   IRON-FA-B CMP-C-BIOT-PROBIOTIC PO Take by mouth daily.   progesterone  (PROMETRIUM ) 100 MG capsule Take 1 capsule (100 mg total) by mouth at bedtime.   Vitamin D , Ergocalciferol , (DRISDOL ) 1.25 MG (50000 UNIT) CAPS capsule TAKE 1 CAPSULE BY MOUTH TWICE WEEKLY FOR VITD DEFICIENCY   [DISCONTINUED] ferrous sulfate  325 (65 FE) MG EC tablet Take 1 tablet (325 mg total) by mouth 2 (two) times daily.   [DISCONTINUED] SYRINGE/NEEDLE, DISP, 1 ML (B-D SYRINGE/NEEDLE 1CC/25GX5/8) 25G X 5/8" 1 ML MISC 1 SQ injection once weekly   No facility-administered medications prior to visit.    No Known Allergies  Patient Care Team: Mandy Second, [place] as PCP - General (Physician Assistant) Feliberto Hopping, FNP as Nurse Practitioner (Obstetrics and Gynecology) Avis Boehringer, MD as Consulting Physician (Dermatology) Anselmo Kings, MD as Referring Physician (Allergy and Immunology)  ROS Complete 12 point ROS performed with all pertinent positives listed in HPI      Objective  BP 112/76   Pulse 71   Ht 5\' 4"  (1.626 m)   Wt 153 lb 6.4 oz (69.6  kg)   LMP 07/24/2023 (Exact Date)   SpO2 100%   BMI 26.33 kg/m  BP Readings from Last 3 Encounters:  08/13/23 112/76  08/04/23 101/66  07/07/23 112/78   Wt Readings from Last 3 Encounters:  08/13/23 153 lb 6.4 oz (69.6 kg)  08/04/23 156 lb (70.8 kg)  06/08/23 151 lb (68.5 kg)      Physical Exam Vitals and nursing note reviewed.  Constitutional:      General: She is not in acute distress.    Appearance: Normal appearance. She is not ill-appearing, toxic-appearing or diaphoretic.  HENT:     Head: Normocephalic and atraumatic.     Right Ear: Tympanic membrane, ear canal and external ear normal. There is no impacted cerumen.     Left Ear: Tympanic membrane, ear canal and external ear normal. There is no impacted cerumen.     Nose: Nose normal.     Mouth/Throat:     Mouth: Mucous membranes are moist.     Pharynx: Oropharynx is clear. No oropharyngeal exudate or posterior oropharyngeal erythema.  Eyes:     General: No scleral icterus.       Right eye: No discharge.        Left eye: No discharge.     Extraocular Movements: Extraocular movements intact.     Pupils: Pupils are equal, round, and reactive to light.  Neck:     Thyroid: No thyroid mass, thyromegaly or thyroid tenderness.  Cardiovascular:     Rate and Rhythm: Normal rate and regular rhythm.     Pulses: Normal pulses.     Heart sounds: No murmur heard. Pulmonary:     Effort: Pulmonary effort is normal. No respiratory distress.     Breath sounds: Normal breath sounds. No stridor. No wheezing or rhonchi.  Abdominal:     General: Abdomen is flat. Bowel sounds are normal. There is no distension.     Palpations: Abdomen is soft. There is no mass.     Tenderness: There is no abdominal tenderness. There is no guarding.  Musculoskeletal:     Cervical back: Normal range of motion and neck supple. No rigidity or tenderness.  Right lower leg: No edema.     Left lower leg: No edema.  Lymphadenopathy:     Cervical: No  cervical adenopathy.  Skin:    General: Skin is warm and dry.     Coloration: Skin is not jaundiced.     Findings: No bruising, erythema or rash.  Neurological:     General: No focal deficit present.     Mental Status: She is alert and oriented to person, place, and time.     Sensory: No sensory deficit.     Motor: No weakness.  Psychiatric:        Mood and Affect: Mood normal.        Behavior: Behavior normal.       Most recent functional status assessment:    08/04/2023    9:22 AM  In your present state of health, do you have any difficulty performing the following activities:  Hearing? 0  Vision? 0  Difficulty concentrating or making decisions? 0   Most recent fall risk assessment:    08/13/2023    9:57 AM  Fall Risk   Falls in the past year? 0  Number falls in past yr: 0  Injury with Fall? 0  Risk for fall due to : No Fall Risks  Follow up Falls evaluation completed    Most recent depression screenings:    08/13/2023    9:57 AM 06/08/2023   11:57 AM  PHQ 2/9 Scores  PHQ - 2 Score 0 0  PHQ- 9 Score 1    Most recent cognitive screening:     No data to display         Most recent Audit-C alcohol use screening    08/12/2023    8:34 AM  Alcohol Use Disorder Test (AUDIT)  1. How often do you have a drink containing alcohol? 2  2. How many drinks containing alcohol do you have on a typical day when you are drinking? 0  3. How often do you have six or more drinks on one occasion? 0  AUDIT-C Score 2      Patient-reported   A score of 3 or more in women, and 4 or more in men indicates increased risk for alcohol abuse, EXCEPT if all of the points are from question 1   Vision/Hearing Screen: No results found.   Assessment & Plan   Annual wellness visit done today including the all of the following: Reviewed patient's Family Medical History Reviewed and updated list of patient's medical providers Assessment of cognitive impairment was done Assessed  patient's functional ability Established a written schedule for health screening services Health Risk Assessent Completed and Reviewed  Exercise Activities and Dietary recommendations  Goals    - Lose 10# in the next 6-12 months through increased physical activity and dietary changes     Immunization History  Administered Date(s) Administered   Influenza Split 02/06/2015   Influenza-Unspecified 01/05/2014, 01/13/2019   Moderna Sars-Covid-2 Vaccination 06/28/2019, 07/26/2019   PPD Test 03/21/2013, 03/24/2014   Td 04/08/2003   Tdap 03/24/2014   Zoster Recombinant(Shingrix ) 08/13/2023    Health Maintenance  Topic Date Due   COVID-19 Vaccine (3 - Moderna risk series) 09/01/2024 (Originally 08/23/2019)   Zoster Vaccines- Shingrix  (2 of 2) 10/08/2023   INFLUENZA VACCINE  11/06/2023   MAMMOGRAM  02/13/2024   DTaP/Tdap/Td (3 - Td or Tdap) 03/24/2024   Colonoscopy  07/13/2026   Cervical Cancer Screening (HPV/Pap Cotest)  06/07/2028   HPV VACCINES  Aged Out  Meningococcal B Vaccine  Aged Out   Hepatitis C Screening  Discontinued   HIV Screening  Discontinued     Discussed health benefits of physical activity, and encouraged her to engage in regular exercise appropriate for her age and condition.    Problem List Items Addressed This Visit     Overweight (BMI 25.0-29.9)   Relevant Orders   Hemoglobin A1c (Completed)   TSH (Completed)   Lipid panel (Completed)   Comprehensive metabolic panel with GFR (Completed)   Iron deficiency anemia   Relevant Orders   CBC with Differential/Platelet (Completed)   B12 and Folate Panel (Completed)   Iron, TIBC and Ferritin Panel (Completed)   B12 deficiency   Relevant Orders   B12 and Folate Panel (Completed)   Other Visit Diagnoses       Well adult health check    -  Primary   Relevant Orders   CBC with Differential/Platelet (Completed)   Hemoglobin A1c (Completed)   TSH (Completed)   Lipid panel (Completed)   Comprehensive  metabolic panel with GFR (Completed)   VITAMIN D  25 Hydroxy (Vit-D Deficiency, Fractures) (Completed)   B12 and Folate Panel (Completed)     Vitamin D  deficiency       Relevant Orders   VITAMIN D  25 Hydroxy (Vit-D Deficiency, Fractures) (Completed)     Need for shingles vaccine       Relevant Orders   Varicella-zoster vaccine IM (Completed)      Assessment and Plan    Wellness Visit Perimenopausal with brain fog and irregular cycles. Discussed weight management, dietary habits, basal metabolic rate, and caloric deficit. Provided dietary management handout. - Perform routine screening labs including vitamin D  and lipid panel. - Encourage increased physical activity and dietary changes for 10-pound weight loss in 6-12 months.  Iron deficiency anemia Iron deficiency anemia likely secondary to uterine polyps, recently removed. Previous labs showed low hemoglobin and iron. - Recheck iron levels as part of routine screening labs.  Vitamin D  deficiency Vitamin D  deficiency managed with vitamin D2 50,000 units weekly. Recent levels normal. Discussed potential transition to OTC vitamin D3 if levels trend high. - Continue vitamin D2 50,000 units weekly. - Recheck vitamin D  levels as part of routine screening labs.  B12 deficiency B12 deficiency previously treated with injections. Levels normalized. Not on supplementation currently. - Check B12 levels as part of routine screening labs.  Hyperlipidemia Hx of hyperlipidemia noted on chart, however last lipid panel normal.  - Check lipid panel as part of routine screening labs.  Adenomatous polyp Tubular adenomatous polyp removed during colonoscopy in 2021. Recent uterine polyps removed with normal pathology.  Melanoma Melanoma with annual dermatology follow-ups. No current concerns.  General Health Maintenance Up to date with mammogram and bone density scan. Due for Shingrix  vaccine. Discussed Shingrix  vaccine, requires two doses, may  cause mild symptoms post-vaccination. - Administer Shingrix  vaccine today. - Schedule second dose of Shingrix  vaccine in 2 months.  Follow-up Plan for follow-up on routine lab results and any concerns. - Review lab results via MyChart and communicate any concerns. - Schedule follow-up appointment as needed based on lab results.       Return in about 2 months (around 10/13/2023). For repeat shingrix  vaccine.    Mandy Second, PA

## 2023-08-14 ENCOUNTER — Ambulatory Visit (INDEPENDENT_AMBULATORY_CARE_PROVIDER_SITE_OTHER)

## 2023-08-14 DIAGNOSIS — E538 Deficiency of other specified B group vitamins: Secondary | ICD-10-CM

## 2023-08-14 LAB — IRON,TIBC AND FERRITIN PANEL
%SAT: 6 % — ABNORMAL LOW (ref 16–45)
Ferritin: 8 ng/mL — ABNORMAL LOW (ref 16–232)
Iron: 28 ug/dL — ABNORMAL LOW (ref 45–160)
TIBC: 486 ug/dL — ABNORMAL HIGH (ref 250–450)

## 2023-08-14 MED ORDER — CYANOCOBALAMIN 1000 MCG/ML IJ SOLN
1000.0000 ug | INTRAMUSCULAR | Status: AC
  Administered 2023-08-14 – 2023-08-21 (×2): 1000 ug via INTRAMUSCULAR

## 2023-08-14 MED ORDER — CYANOCOBALAMIN 1000 MCG/ML IJ SOLN: 1000.0000 ug | INTRAMUSCULAR | 3 refills | Status: AC

## 2023-08-14 NOTE — Progress Notes (Signed)
 Pt here for 1st B12 injection per Corita Diego, PA-C     B12 1000mcg given IM, and pt tolerated injection well.  Next B12 injection scheduled for 1 week

## 2023-08-17 ENCOUNTER — Encounter: Payer: 59 | Admitting: Nurse Practitioner

## 2023-08-19 ENCOUNTER — Ambulatory Visit (INDEPENDENT_AMBULATORY_CARE_PROVIDER_SITE_OTHER): Admitting: Obstetrics and Gynecology

## 2023-08-19 ENCOUNTER — Encounter: Payer: Self-pay | Admitting: Obstetrics and Gynecology

## 2023-08-19 VITALS — BP 110/80 | HR 73

## 2023-08-19 DIAGNOSIS — N8110 Cystocele, unspecified: Secondary | ICD-10-CM

## 2023-08-19 DIAGNOSIS — N950 Postmenopausal bleeding: Secondary | ICD-10-CM

## 2023-08-19 DIAGNOSIS — N938 Other specified abnormal uterine and vaginal bleeding: Secondary | ICD-10-CM

## 2023-08-19 DIAGNOSIS — N840 Polyp of corpus uteri: Secondary | ICD-10-CM

## 2023-08-19 DIAGNOSIS — N3281 Overactive bladder: Secondary | ICD-10-CM

## 2023-08-19 NOTE — Progress Notes (Signed)
   Acute Office Visit  Subjective:    Patient ID: Anna Hernandez, female    DOB: 1971-04-01, 53 y.o.   MRN: [id number]   HPI 53 y.o. presents today for post op (Post op//jj/08-04-23 myosure hysteroscopy D&C, endocervical curettage/) . Postoperatively patient reports she did well no heavy bleeding, pain or fevers. She is seeing Dr. Aron Lard soon for cystocele. She does not feel like she is fully emptying her bladder when she voids. She can feel a vaginal bulge.  Pathology: with benign submucosal fibroid and adenomyosis no malignancy  She has started using the estrogen patch and at bedtime prometrium  and is doing well on this. Intrarosa  also sent in H/o endometrioma found at the cesarean delivery Occasionally sexually active  Patient's last menstrual period was 07/24/2023 (exact date).    Review of Systems     Objective:     OBGyn Exam  BP 110/80   Pulse 73   LMP 07/24/2023 (Exact Date)   SpO2 99%  Wt Readings from Last 3 Encounters:  08/13/23 153 lb 6.4 oz (69.6 kg)  08/04/23 156 lb (70.8 kg)  06/08/23 151 lb (68.5 kg)        Patient informed chaperone available to be present for breast and/or pelvic exam. Patient has requested no chaperone to be present. Patient has been advised what will be completed during breast and pelvic exam.   Assessment & Plan:  DUB, menorrhagia, endometriosis, fibroids, cystocele, OAB Discussed options with [facility] with vaginal prolapse repair, if indicated by Dr. Aron Lard.  She will discuss options at her consult and is considering a combined case.  She will let us  know. 2.  Pathology reviewed and printed for patient and benign.  Counseled on s/s of adenomyosis. Reinaldo Caras

## 2023-08-21 ENCOUNTER — Ambulatory Visit

## 2023-08-21 DIAGNOSIS — E538 Deficiency of other specified B group vitamins: Secondary | ICD-10-CM | POA: Diagnosis not present

## 2023-08-21 NOTE — Progress Notes (Signed)
 Pt here for monthly B12 injection per Corita Diego, PA-C  Last B12 injection: 08/14/23   B12 1000mcg given IM, and pt tolerated injection well.  Next B12 injection scheduled for: 08/28/23

## 2023-08-24 NOTE — Progress Notes (Signed)
 New Patient Evaluation and Consultation  Referring Provider: Reinaldo Caras, MD PCP: Mandy Second, [place] Date of Service: 08/25/2023  SUBJECTIVE Chief Complaint: New Patient (Initial Visit) Anna Hernandez is a 53 y.o. femaleoveractive bladder, urinary incontinence, and pelvic organ prolapse.  )  History of Present Illness: Anna Hernandez is a 53 y.o. White or Caucasian female seen in consultation at the request of Dr Tia Flowers for evaluation of overactive bladder, urinary incontinence, and pelvic organ prolapse.    Urinary symptoms started around 10 years ago when she noticed that she could not jump on a trampoline.  Reports reduced leakage with fluid management and 10lb weight, no longer use a pad daily.  Underwent hysteroscopy, D&C by Dr. Tia Flowers on 08/04/23 for AUB with weakly proliferative endometrium and submucosal fibroid, desires to proceed with surgical intervention  Tried home pelvic floor exercises Lichen sclerosus of vulva well on Clobetasol  ointment PRN  Review of records significant for: PONV, h/o tobacco use  TVUS 07/07/23 "9.97 cm anteverted uterus Normal size and shape No myometrial masses   Cervix: at least one 9.9 x 5.30mm possible polyp with feeder vessel seen (3D imaging questions two other areas or polyps) Endometrial lining: 11mm avascular area seen ub Fundal region ?polyp   Both ovaries normal size with normal follicle pattern   No adnexal masses No free fluid"  Urinary Symptoms: Leaks urine with cough/ sneeze, laughing, exercise, lifting, and with a full bladder Leaks 0-1 time(s) per days with activity, fluctuates with hydration and managed by restricting fluid intake when she is out of her home. Sometimes 1x/week Leakage with urgency 1x/month Pad use: 1 liners/ mini-pads per week when she goes out Patient is bothered by UI symptoms.  Day time voids 6-10 since 40s with weight gain.  Nocturia: 2-3 times per night to void for > 5  year. Wakes up 3-4x/night due to hot flashes started in 02/2023 treated with black cohosh and started Dotti  estrogen patch and Prometrium .  Stops fluid intake an 1-2 hours before bedtime Denies snoring or leg swelling. Voiding dysfunction:  empties bladder well.  Patient does not use a catheter to empty bladder.  When urinating, patient feels a weak stream, the need to urinate multiple times in a row, and to push on her belly or vagina to empty bladder Drinks: 48-64oz water per day, 8-16oz cup of coffee  UTIs: 0 UTI's in the last year.   Denies history of blood in urine, kidney or bladder stones, pyelonephritis, bladder cancer, and kidney cancer No results found for the last 90 days.   Pelvic Organ Prolapse Symptoms:                  Patient Denies a feeling of a bulge the vaginal area. Reports sensation of vaginal bulge for a few years inside the vagina. Denies bother.   Bowel Symptom: Bowel movements: 2-3 time(s) per week, bristol III-IV Stool consistency: loose Straining: no.  Splinting: no.  Incomplete evacuation: no.  Patient Denies accidental bowel leakage / fecal incontinence Bowel regimen: smooth move tea PRN Last colonoscopy: Results internal hemorrhoid, skin tags, polypectomy, diverticula in colon    HM Colonoscopy          Upcoming     Colonoscopy (Every 7 Years) Next due on 07/13/2026    07/13/2019  COLONOSCOPY   Only the first 1 history entries have been loaded, but more history exists.  Sexual Function Sexually active: no.  Sexual orientation: Straight Pain with sex: No  Pelvic Pain Denies pelvic pain  Past Medical History:  Past Medical History:  Diagnosis Date   Allergy    Seasonal   B12 deficiency anemia    Cancer (HCC)    melanoma    Melanoma (HCC)    PONV (postoperative nausea and vomiting)      Past Surgical History:   Past Surgical History:  Procedure Laterality Date   CESAREAN SECTION     LEFT OVARIAN  ENDOMETRIOMA AND BTSP   COLONOSCOPY W/ POLYPECTOMY  2021   DILATATION & CURRETTAGE/HYSTEROSCOPY WITH RESECTOCOPE N/A 08/04/2023   Procedure: DILATATION & CURETTAGE/HYSTEROSCOPY WITH MYOSURE;  Surgeon: Reinaldo Caras, MD;  [facility];  Service: Gynecology;  Laterality: N/A;  MYOSURE and ECC   Melanoma excised     TUBAL LIGATION     AT TIME OF C/S   WISDOM TOOTH EXTRACTION       Past OB/GYN History: OB History  Gravida Para Term Preterm AB Living  3 3 3   4   SAB IAB Ectopic Multiple Live Births     1 3    # Outcome Date GA Lbr Len/2nd Weight Sex Type Anes PTL Lv  3A Term     F Vag-Spont  N   3B Term     F Vag-Spont  N LIV  2 Term     M Vag-Spont  N LIV  1 Term     M Vag-Forceps  N LIV    Vaginal deliveries: 2, largest infant 10lbs. Laceration repaired in the operating room.  Forceps/ Vacuum deliveries: pt denies, Cesarean section: 1 for twins Menopausal: no, prior irregular cycle  Contraception: BTL. Last pap smear.  Any history of abnormal pap smears: no.    Component Value Date/Time   DIAGPAP  06/08/2023 1237    - Negative for intraepithelial lesion or malignancy (NILM)   DIAGPAP  03/06/2021 1701    - Negative for Intraepithelial Lesions or Malignancy (NILM)   DIAGPAP - Benign reactive/reparative changes 03/06/2021 1701   HPVHIGH Negative 03/06/2021 1701   ADEQPAP  06/08/2023 1237    Satisfactory for evaluation; transformation zone component PRESENT.   ADEQPAP  03/06/2021 1701    Satisfactory for evaluation; transformation zone component PRESENT.    Medications: Patient has a current medication list which includes the following prescription(s): cyanocobalamin , estradiol , [START ON 08/27/2023] estradiol , ferrous sulfate , fexofenadine hcl, fluticasone , intrarosa , progesterone , vitamin d  (ergocalciferol ), and clobetasol  ointment, and the following Facility-Administered Medications: cyanocobalamin .   Allergies: Patient has no known allergies.   Social History:   Social History   Tobacco Use   Smoking status: Former    Current packs/day: 0.00    Average packs/day: 1 pack/day for 12.0 years (12.0 ttl pk-yrs)    Types: Cigarettes    Start date: 03/21/1986    Quit date: 03/21/1998    Years since quitting: 25.4   Smokeless tobacco: Never  Vaping Use   Vaping status: Never Used  Substance Use Topics   Alcohol use: Yes    Alcohol/week: 1.0 standard drink of alcohol    Types: 1 Standard drinks or equivalent per week   Drug use: No    Relationship status: married Patient lives with her husband and children.   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 Regular exercise: Yes: walk History of abuse: No  Family History:   Family History  Problem Relation Age of Onset  Hypertension Mother    Stroke Mother        TIA   Colon polyps Mother    Melanoma Mother    Arthritis Mother    Varicose Veins Mother    Hypertension Father    Diabetes Father    Colon polyps Father    Hyperlipidemia Father    Cancer Maternal Grandmother        OVARIAN/ leukemia   Heart disease Maternal Grandfather    Cancer Paternal Grandmother        Brain tumor meningioma   Cancer Paternal Grandfather        Lung   Bladder Cancer Neg Hx    Kidney cancer Neg Hx      Review of Systems: Review of Systems  Constitutional:  Negative for fever, malaise/fatigue and weight loss.  Respiratory:  Negative for cough, shortness of breath and wheezing.   Cardiovascular:  Negative for chest pain, palpitations and leg swelling.  Gastrointestinal:  Positive for constipation. Negative for abdominal pain and blood in stool.  Genitourinary:  Positive for frequency. Negative for dysuria, hematuria and urgency.       Leakage  Skin:  Negative for rash.  Neurological:  Negative for dizziness, weakness and headaches.  Endo/Heme/Allergies:  Does not bruise/bleed easily.  Psychiatric/Behavioral:  Negative for depression. The patient is not nervous/anxious.       OBJECTIVE Physical Exam: Vitals:   08/25/23 0955  BP: 105/73  Pulse: 67  Weight: 153 lb (69.4 kg)  Height: 5' 2.99" (1.6 m)    Physical Exam Constitutional:      General: She is not in acute distress.    Appearance: Normal appearance.  Genitourinary:     Bladder and urethral meatus normal.     No lesions in the vagina.     Right Labia: No rash, tenderness, lesions, skin changes or Bartholin's cyst.    Left Labia: No tenderness, lesions, skin changes, Bartholin's cyst or rash.       No vaginal discharge, erythema, tenderness, bleeding, ulceration or granulation tissue.     Anterior and apical vaginal prolapse present.    Mild vaginal atrophy present.     Right Adnexa: not tender, not full and no mass present.    Left Adnexa: not tender, not full and no mass present.    No cervical motion tenderness, discharge, friability, lesion, polyp or nabothian cyst.     Uterus is prolapsed.     Uterus is not enlarged, fixed, tender or irregular.     No uterine mass detected.    Urethral meatus caruncle not present.    No urethral prolapse, tenderness, mass, hypermobility, discharge or stress urinary incontinence with cough stress test present.     Bladder is not tender, urgency on palpation not present and masses not present.      Pelvic Floor: Levator muscle strength is 3/5.    Levator ani not tender, obturator internus not tender, no asymmetrical contractions present and no pelvic spasms present.    Symmetrical pelvic sensation, anal wink present and BC reflex present. Cardiovascular:     Rate and Rhythm: Normal rate.  Pulmonary:     Effort: Pulmonary effort is normal. No respiratory distress.  Abdominal:     General: There is no distension.     Palpations: Abdomen is soft. There is no mass.     Tenderness: There is no abdominal tenderness.     Hernia: No hernia is present.  Neurological:     Mental Status: She  is alert.  Vitals reviewed. Exam conducted with a chaperone  present.     POP-Q:   POP-Q  -1                                            Aa   -1                                           Ba  -6                                              C   2                                            Gh  2                                            Pb  9                                            tvl   -2                                            Ap  -2                                            Bp  -8                                              D    Post-Void Residual (PVR) by Bladder Scan: In order to evaluate bladder emptying, we discussed obtaining a postvoid residual and patient agreed to this procedure.  Procedure: The ultrasound unit was placed on the patient's abdomen in the suprapubic region after the patient had voided.    Post Void Residual - 08/25/23 1027       Post Void Residual   Post Void Residual 24 mL              Laboratory Results: Lab Results  Component Value Date   COLORU yellow 08/25/2023   CLARITYU clear 08/25/2023   GLUCOSEUR Negative 08/25/2023   BILIRUBINUR negative 08/25/2023   KETONESU negative 08/25/2023   SPECGRAV 1.015 08/25/2023   RBCUR negative 08/25/2023   PHUR 7.0 08/25/2023   PROTEINUR Negative 08/25/2023   UROBILINOGEN 0.2 08/25/2023   LEUKOCYTESUR Negative 08/25/2023    Lab Results  Component Value Date   CREATININE  0.68 08/13/2023   CREATININE 0.66 08/14/2022   CREATININE 0.71 08/01/2021    Lab Results  Component Value Date   HGBA1C 5.5 08/13/2023    Lab Results  Component Value Date   HGB 12.3 08/13/2023     ASSESSMENT AND PLAN Ms. Hernandez is a 53 y.o. with:  1. Slow transit constipation   2. Nocturia   3. Pelvic organ prolapse quantification stage 2 cystocele   4. SUI (stress urinary incontinence, female)   5. Lichen sclerosus   6. Vaginal atrophy     Slow transit constipation Assessment & Plan: - For constipation, we reviewed the importance of a  better bowel regimen.  We also discussed the importance of avoiding chronic straining, as it can exacerbate her pelvic floor symptoms; we discussed treating constipation and straining prior to surgery, as postoperative straining can lead to damage to the repair and recurrence of symptoms. We discussed initiating therapy with increasing fluid intake, fiber supplementation, stool softeners, and laxatives such as miralax.  - encouraged to start fiber supplementation to optimize stool consistency 3x/week prior to surgery - diverticula and hemorrhoids noted on colonoscopy in 2021   Nocturia Assessment & Plan: - avoid fluid intake 3 hours before bedtime   Orders: -     POCT urinalysis dipstick  Pelvic organ prolapse quantification stage 2 cystocele Assessment & Plan: - mild symptoms - desires to undergo hysterectomy by Dr. Tia Flowers and desires concomitant prolapse repair - For treatment of pelvic organ prolapse, we discussed options for management including expectant management, conservative management, and surgical management, such as Kegels, a pessary, pelvic floor physical therapy, and specific surgical procedures. We discussed two options for prolapse repair:  1) vaginal repair without mesh - Pros - safer, no mesh complications - Cons - not as strong as mesh repair, higher risk of recurrence  2) laparoscopic repair with mesh - Pros - stronger, better long-term success - Cons - risks of mesh implant (erosion into vagina or bladder, adhering to the rectum, pain) - these risks are lower than with a vaginal mesh but still exist - pt desires to proceed with laparoscopic vaginal vault suspension, possible anterior/posterior repair, possible perineorrhaphy - will coordinate care with Dr. Tia Flowers.  Orders: -     Estradiol ; Place 0.5-1g twice a week  Dispense: 30 g; Refill: 3 -     Ambulatory Referral For Surgery Scheduling  SUI (stress urinary incontinence, female) Assessment & Plan: - POCT  UA negative, PVR 24mL - For treatment of stress urinary incontinence,  non-surgical options include expectant management, weight loss, physical therapy, as well as a pessary.  Surgical options include a midurethral sling, Burch urethropexy, and transurethral injection of a bulking agent. - we discussed office procedure with urethral bulking (Bulkamid). We discussed success rate of approximately 70-80% and possible need for second injection. We reviewed that this is not a permanent procedure and the Bulkamid does dissolve over time. Risks reviewed including injury to bladder or urethra, UTI, urinary retention and hematuria.  - Sling: The effectiveness of a midurethral vaginal mesh sling is approximately 85%, and thus, there will be times when you may leak urine after surgery, especially if your bladder is full or if you have a strong cough. There is a balance between making the sling tight enough to treat your leakage but not too tight so that you have long-term difficulty emptying your bladder. A mesh sling will not directly treat overactive bladder/urge incontinence and may worsen it.  There is an FDA safety  notification on vaginal mesh procedures for prolapse but NOT mesh slings. We have extensive experience and training with mesh placement and we have close postoperative follow up to identify any potential complications from mesh. It is important to realize that this mesh is a permanent implant that cannot be easily removed. There are rare risks of mesh exposure (2-4%), pain with intercourse (0-7%), and infection (<1%). The risk of mesh exposure if more likely in a woman with risks for poor healing (prior radiation, poorly controlled diabetes, or immunocompromised). The risk of new or worsened chronic pain after mesh implant is more common in women with baseline chronic pain and/or poorly controlled anxiety or depression. Approximately 2-4% of patients will experience longer-term post-operative voiding  dysfunction that may require surgical revision of the sling. We also reviewed that postoperatively, her stream may not be as strong as before surgery.  - pt desires midurethral sling with cystourethroscopy - encouraged to continue weight reduction due to relief of urinary leakage and continue Kegel exercises - return to office with full bladder for repeat CST - trial of low dose vaginal estrogen  Orders: -     Estradiol ; Place 0.5-1g twice a week  Dispense: 30 g; Refill: 3 -     Ambulatory Referral For Surgery Scheduling  Lichen sclerosus Assessment & Plan: - reports clobetasol  use as needed for vulvar symptoms - area of hypopigementation at introitus, denies history of biopsy - discussed vulvar biopsy at the time of surgery - encouraged proper vulvar care, comfort measures, treatments  - discussed importance of topical steroid use due to risk of malignancy and low threshold for repeat biopsy - discussed importance of vulvar exam every 6-12 months with Dr. Tia Flowers to monitor symptoms   Orders: -     Ambulatory Referral For Surgery Scheduling  Vaginal atrophy Assessment & Plan: - For symptomatic vaginal atrophy options include lubrication with a water-based lubricant, personal hygiene measures and barrier protection against wetness, and estrogen replacement in the form of vaginal cream, vaginal tablets, or a time-released vaginal ring.   - Rx for low dose vaginal estrogen   Return with full bladder for repeat CST.  Time spent: I spent 67 minutes dedicated to the care of this patient on the date of this encounter to include pre-visit review of records, face-to-face time with the patient discussing stage II pelvic organ prolapse, stress urinary incontinence, lichen sclerosus, nocturia, constipation, and post visit documentation and ordering medication/ testing.    Darlene Ehlers, MD

## 2023-08-25 ENCOUNTER — Ambulatory Visit: Admitting: Obstetrics

## 2023-08-25 ENCOUNTER — Encounter: Payer: Self-pay | Admitting: Obstetrics

## 2023-08-25 VITALS — BP 105/73 | HR 67 | Ht 62.99 in | Wt 153.0 lb

## 2023-08-25 DIAGNOSIS — L900 Lichen sclerosus et atrophicus: Secondary | ICD-10-CM | POA: Insufficient documentation

## 2023-08-25 DIAGNOSIS — K5901 Slow transit constipation: Secondary | ICD-10-CM

## 2023-08-25 DIAGNOSIS — N8110 Cystocele, unspecified: Secondary | ICD-10-CM | POA: Insufficient documentation

## 2023-08-25 DIAGNOSIS — N393 Stress incontinence (female) (male): Secondary | ICD-10-CM | POA: Diagnosis not present

## 2023-08-25 DIAGNOSIS — R351 Nocturia: Secondary | ICD-10-CM | POA: Insufficient documentation

## 2023-08-25 DIAGNOSIS — N952 Postmenopausal atrophic vaginitis: Secondary | ICD-10-CM | POA: Insufficient documentation

## 2023-08-25 LAB — POCT URINALYSIS DIPSTICK
Bilirubin, UA: NEGATIVE
Blood, UA: NEGATIVE
Glucose, UA: NEGATIVE
Ketones, UA: NEGATIVE
Leukocytes, UA: NEGATIVE
Nitrite, UA: NEGATIVE
Protein, UA: NEGATIVE
Spec Grav, UA: 1.015 (ref 1.010–1.025)
Urobilinogen, UA: 0.2 U/dL
pH, UA: 7 (ref 5.0–8.0)

## 2023-08-25 MED ORDER — ESTRADIOL 0.1 MG/GM VA CREA: TOPICAL_CREAM | VAGINAL | 3 refills | Status: AC

## 2023-08-25 NOTE — Assessment & Plan Note (Addendum)
-   POCT UA negative, PVR 24mL - For treatment of stress urinary incontinence,  non-surgical options include expectant management, weight loss, physical therapy, as well as a pessary.  Surgical options include a midurethral sling, Burch urethropexy, and transurethral injection of a bulking agent. - we discussed office procedure with urethral bulking (Bulkamid). We discussed success rate of approximately 70-80% and possible need for second injection. We reviewed that this is not a permanent procedure and the Bulkamid does dissolve over time. Risks reviewed including injury to bladder or urethra, UTI, urinary retention and hematuria.  - Sling: The effectiveness of a midurethral vaginal mesh sling is approximately 85%, and thus, there will be times when you may leak urine after surgery, especially if your bladder is full or if you have a strong cough. There is a balance between making the sling tight enough to treat your leakage but not too tight so that you have long-term difficulty emptying your bladder. A mesh sling will not directly treat overactive bladder/urge incontinence and may worsen it.  There is an FDA safety notification on vaginal mesh procedures for prolapse but NOT mesh slings. We have extensive experience and training with mesh placement and we have close postoperative follow up to identify any potential complications from mesh. It is important to realize that this mesh is a permanent implant that cannot be easily removed. There are rare risks of mesh exposure (2-4%), pain with intercourse (0-7%), and infection (<1%). The risk of mesh exposure if more likely in a woman with risks for poor healing (prior radiation, poorly controlled diabetes, or immunocompromised). The risk of new or worsened chronic pain after mesh implant is more common in women with baseline chronic pain and/or poorly controlled anxiety or depression. Approximately 2-4% of patients will experience longer-term post-operative voiding  dysfunction that may require surgical revision of the sling. We also reviewed that postoperatively, her stream may not be as strong as before surgery.  - pt desires midurethral sling with cystourethroscopy - encouraged to continue weight reduction due to relief of urinary leakage and continue Kegel exercises - return to office with full bladder for repeat CST - trial of low dose vaginal estrogen

## 2023-08-25 NOTE — Assessment & Plan Note (Signed)
-   For symptomatic vaginal atrophy options include lubrication with a water-based lubricant, personal hygiene measures and barrier protection against wetness, and estrogen replacement in the form of vaginal cream, vaginal tablets, or a time-released vaginal ring.   - Rx for low dose vaginal estrogen

## 2023-08-25 NOTE — Assessment & Plan Note (Signed)
-   For constipation, we reviewed the importance of a better bowel regimen.  We also discussed the importance of avoiding chronic straining, as it can exacerbate her pelvic floor symptoms; we discussed treating constipation and straining prior to surgery, as postoperative straining can lead to damage to the repair and recurrence of symptoms. We discussed initiating therapy with increasing fluid intake, fiber supplementation, stool softeners, and laxatives such as miralax.  - encouraged to start fiber supplementation to optimize stool consistency 3x/week prior to surgery - diverticula and hemorrhoids noted on colonoscopy in 2021

## 2023-08-25 NOTE — Assessment & Plan Note (Signed)
-   reports clobetasol  use as needed for vulvar symptoms - area of hypopigementation at introitus, denies history of biopsy - discussed vulvar biopsy at the time of surgery - encouraged proper vulvar care, comfort measures, treatments  - discussed importance of topical steroid use due to risk of malignancy and low threshold for repeat biopsy - discussed importance of vulvar exam every 6-12 months with Dr. Tia Flowers to monitor symptoms

## 2023-08-25 NOTE — Assessment & Plan Note (Signed)
-   avoid fluid intake 3 hours before bedtime

## 2023-08-25 NOTE — Assessment & Plan Note (Addendum)
-   mild symptoms - desires to undergo hysterectomy by Dr. Tia Flowers and desires concomitant prolapse repair - For treatment of pelvic organ prolapse, we discussed options for management including expectant management, conservative management, and surgical management, such as Kegels, a pessary, pelvic floor physical therapy, and specific surgical procedures. We discussed two options for prolapse repair:  1) vaginal repair without mesh - Pros - safer, no mesh complications - Cons - not as strong as mesh repair, higher risk of recurrence  2) laparoscopic repair with mesh - Pros - stronger, better long-term success - Cons - risks of mesh implant (erosion into vagina or bladder, adhering to the rectum, pain) - these risks are lower than with a vaginal mesh but still exist - pt desires to proceed with laparoscopic vaginal vault suspension, possible anterior/posterior repair, possible perineorrhaphy - will coordinate care with Dr. Tia Flowers.

## 2023-08-25 NOTE — Patient Instructions (Addendum)
 You have a stage 2 (out of 4) prolapse.  We discussed the fact that it is not life threatening but there are several treatment options. For treatment of pelvic organ prolapse, we discussed options for management including expectant management, conservative management, and surgical management, such as Kegels, a pessary, pelvic floor physical therapy, and specific surgical procedures.     We discussed two options for prolapse repair:  1) vaginal repair without mesh - Pros - safer, no mesh complications - Cons - not as strong as mesh repair, higher risk of recurrence  2) laparoscopic repair with mesh - Pros - stronger, better long-term success - Cons - risks of mesh implant (erosion into vagina or bladder, adhering to the rectum, pain) - these risks are lower than with a vaginal mesh but still exist  For treatment of stress urinary incontinence, which is leakage with physical activity/movement/strainging/coughing, we discussed expectant management versus nonsurgical options versus surgery. Nonsurgical options include weight loss, physical therapy, as well as a pessary.  Surgical options include a midurethral sling, which is a synthetic mesh sling that acts like a hammock under the urethra to prevent leakage of urine, a Burch urethropexy, and transurethral injection of a bulking agent.   We discussed office procedure with urethral bulking (Bulkamid). We discussed success rate of approximately 70-80% and possible need for second injection. We reviewed that this is not a permanent procedure and the Bulkamid does dissolve over time. Risks reviewed including injury to bladder or urethra, UTI, urinary retention and hematuria.   Sling: The effectiveness of a midurethral vaginal mesh sling is approximately 85%, and thus, there will be times when you may leak urine after surgery, especially if your bladder is full or if you have a strong cough. There is a balance between making the sling tight enough to treat  your leakage but not too tight so that you have long-term difficulty emptying your bladder. A mesh sling will not directly treat overactive bladder/urge incontinence and may worsen it.  There is an FDA safety notification on vaginal mesh procedures for prolapse but NOT mesh slings. We have extensive experience and training with mesh placement and we have close postoperative follow up to identify any potential complications from mesh. It is important to realize that this mesh is a permanent implant that cannot be easily removed. There are rare risks of mesh exposure (2-4%), pain with intercourse (0-7%), and infection (<1%). The risk of mesh exposure if more likely in a woman with risks for poor healing (prior radiation, poorly controlled diabetes, or immunocompromised). The risk of new or worsened chronic pain after mesh implant is more common in women with baseline chronic pain and/or poorly controlled anxiety or depression. Approximately 2-4% of patients will experience longer-term post-operative voiding dysfunction that may require surgical revision of the sling. We also reviewed that postoperatively, her stream may not be as strong as before surgery.   We discussed the symptoms of overactive bladder (OAB), which include urinary urgency, urinary frequency, night-time urination, with or without urge incontinence.  We discussed management including behavioral therapy (decreasing bladder irritants by following a bladder diet, urge suppression strategies, timed voids, bladder retraining), physical therapy, medication; and for refractory cases posterior tibial nerve stimulation, sacral neuromodulation, and intravesical botulinum toxin injection.   For night time frequency: - avoid fluid intake 3 hours before bedtime  Women should try to eat at least 21 to 25 grams of fiber a day, while men should aim for 30 to 38 grams a day.  You can add fiber to your diet with food or a fiber supplement such as psyllium  (metamucil), benefiber, or fibercon.   Here's a look at how much dietary fiber is found in some common foods. When buying packaged foods, check the Nutrition Facts label for fiber content. It can vary among brands.  Fruits Serving size Total fiber (grams)*  Raspberries 1 cup 8.0  Pear 1 medium 5.5  Apple, with skin 1 medium 4.5  Banana 1 medium 3.0  Orange 1 medium 3.0  Strawberries 1 cup 3.0   Vegetables Serving size Total fiber (grams)*  Green peas, boiled 1 cup 9.0  Broccoli, boiled 1 cup chopped 5.0  Turnip greens, boiled 1 cup 5.0  Brussels sprouts, boiled 1 cup 4.0  Potato, with skin, baked 1 medium 4.0  Sweet corn, boiled 1 cup 3.5  Cauliflower, raw 1 cup chopped 2.0  Carrot, raw 1 medium 1.5   Grains Serving size Total fiber (grams)*  Spaghetti, whole-wheat, cooked 1 cup 6.0  Barley, pearled, cooked 1 cup 6.0  Bran flakes 3/4 cup 5.5  Quinoa, cooked 1 cup 5.0  Oat bran muffin 1 medium 5.0  Oatmeal, instant, cooked 1 cup 5.0  Popcorn, air-popped 3 cups 3.5  Brown rice, cooked 1 cup 3.5  Bread, whole-wheat 1 slice 2.0  Bread, rye 1 slice 2.0   Legumes, nuts and seeds Serving size Total fiber (grams)*  Split peas, boiled 1 cup 16.0  Lentils, boiled 1 cup 15.5  Black beans, boiled 1 cup 15.0  Baked beans, canned 1 cup 10.0  Chia seeds 1 ounce 10.0  Almonds 1 ounce (23 nuts) 3.5  Pistachios 1 ounce (49 nuts) 3.0  Sunflower kernels 1 ounce 3.0  *Rounded to nearest 0.5 gram. Source: [employer] for [employer]	[place]

## 2023-08-28 ENCOUNTER — Ambulatory Visit (INDEPENDENT_AMBULATORY_CARE_PROVIDER_SITE_OTHER)

## 2023-08-28 DIAGNOSIS — E538 Deficiency of other specified B group vitamins: Secondary | ICD-10-CM | POA: Diagnosis not present

## 2023-08-28 MED ORDER — CYANOCOBALAMIN 1000 MCG/ML IJ SOLN
1000.0000 ug | Freq: Once | INTRAMUSCULAR | Status: AC
  Administered 2023-08-28: 1000 ug via INTRAMUSCULAR

## 2023-08-28 NOTE — Progress Notes (Signed)
 Pt here for monthly B12 injection per Corita Diego, PA-C   Last B12 injection: 08/14/23     B12 1000mcg given IM, and pt tolerated injection well.   Next B12 injection scheduled for: june

## 2023-09-04 ENCOUNTER — Ambulatory Visit

## 2023-09-04 ENCOUNTER — Ambulatory Visit (INDEPENDENT_AMBULATORY_CARE_PROVIDER_SITE_OTHER)

## 2023-09-04 DIAGNOSIS — E538 Deficiency of other specified B group vitamins: Secondary | ICD-10-CM

## 2023-09-04 MED ORDER — CYANOCOBALAMIN 1000 MCG/ML IJ SOLN
1000.0000 ug | Freq: Once | INTRAMUSCULAR | Status: AC
  Administered 2023-09-04: 1000 ug via INTRAMUSCULAR

## 2023-09-04 NOTE — Progress Notes (Signed)
 Pt here for monthly B12 injection/at-home administration teaching per [facility], PA-C   Last B12 injection: 08/28/2023     B12 1000mcg given IM, and pt tolerated injection well.

## 2023-10-16 ENCOUNTER — Ambulatory Visit

## 2023-11-06 ENCOUNTER — Ambulatory Visit: Admitting: Obstetrics

## 2024-04-18 ENCOUNTER — Encounter: Payer: Self-pay | Admitting: *Deleted
